# Patient Record
Sex: Male | Born: 1985 | Race: Black or African American | Hispanic: No
Health system: Midwestern US, Community
[De-identification: ages and names within clinical notes are randomized; demographics above are authoritative.]

## PROBLEM LIST (undated history)

## (undated) DIAGNOSIS — K279 Peptic ulcer, site unspecified, unspecified as acute or chronic, without hemorrhage or perforation: Secondary | ICD-10-CM

## (undated) HISTORY — PX: ABDOMINAL SURGERY: SHX537

---

## 2017-10-21 ENCOUNTER — Emergency Department (HOSPITAL_COMMUNITY)
Admission: EM | Admit: 2017-10-21 | Discharge: 2017-10-21 | Disposition: A | Discharge status: Home / Self Care | Payer: Self-pay | Attending: Emergency Medicine | Admitting: Emergency Medicine

## 2017-10-21 ENCOUNTER — Encounter (HOSPITAL_COMMUNITY): Payer: Self-pay | Admitting: Emergency Medicine

## 2017-10-21 DIAGNOSIS — R110 Nausea: Secondary | ICD-10-CM | POA: Insufficient documentation

## 2017-10-21 DIAGNOSIS — R070 Pain in throat: Secondary | ICD-10-CM | POA: Insufficient documentation

## 2017-10-21 DIAGNOSIS — Z87891 Personal history of nicotine dependence: Secondary | ICD-10-CM | POA: Insufficient documentation

## 2017-10-21 DIAGNOSIS — R1013 Epigastric pain: Secondary | ICD-10-CM | POA: Insufficient documentation

## 2017-10-21 DIAGNOSIS — R197 Diarrhea, unspecified: Secondary | ICD-10-CM | POA: Insufficient documentation

## 2017-10-21 LAB — COMPREHENSIVE METABOLIC PANEL
ALBUMIN: 4 g/dL (ref 3.5–5.0)
ALK PHOS: 60 U/L (ref 38–126)
ALT: 31 U/L (ref 17–63)
AST: 27 U/L (ref 15–41)
Anion gap: 13 (ref 5–15)
BILIRUBIN TOTAL: 1 mg/dL (ref 0.3–1.2)
BUN: 9 mg/dL (ref 6–20)
CALCIUM: 9.3 mg/dL (ref 8.9–10.3)
CO2: 25 mmol/L (ref 22–32)
Chloride: 100 mmol/L — ABNORMAL LOW (ref 101–111)
Creatinine, Ser: 1.16 mg/dL (ref 0.61–1.24)
GFR calc Af Amer: >60 mL/min (ref >60)
GFR calc non Af Amer: >60 mL/min (ref >60)
GLUCOSE: 94 mg/dL (ref 65–99)
POTASSIUM: 3.8 mmol/L (ref 3.5–5.1)
SODIUM: 138 mmol/L (ref 135–145)
TOTAL PROTEIN: 7.9 g/dL (ref 6.5–8.1)

## 2017-10-21 LAB — LIPASE, BLOOD: Lipase: 22 U/L (ref 11–51)

## 2017-10-21 LAB — URINALYSIS, ROUTINE W REFLEX MICROSCOPIC
BILIRUBIN URINE: NEGATIVE
Glucose, UA: NEGATIVE mg/dL
Ketones, ur: 20 mg/dL — AB
Leukocytes, UA: NEGATIVE
NITRITE: NEGATIVE
PH: 6 (ref 5.0–8.0)
Protein, ur: NEGATIVE mg/dL
SPECIFIC GRAVITY, URINE: 1.01 (ref 1.005–1.030)

## 2017-10-21 LAB — CBC
HEMATOCRIT: 44.1 % (ref 39.0–52.0)
HEMOGLOBIN: 15.3 g/dL (ref 13.0–17.0)
MCH: 30.8 pg (ref 26.0–34.0)
MCHC: 34.7 g/dL (ref 30.0–36.0)
MCV: 88.7 fL (ref 78.0–100.0)
Platelets: 142 10*3/uL — ABNORMAL LOW (ref 150–400)
RBC: 4.97 MIL/uL (ref 4.22–5.81)
RDW: 13.3 % (ref 11.5–15.5)
WBC: 4.8 10*3/uL (ref 4.0–10.5)

## 2017-10-21 MED ORDER — AZITHROMYCIN 250 MG PO TABS
500.0000 mg | ORAL_TABLET | Freq: Once | ORAL | Status: AC
  Administered 2017-10-21: 500 mg via ORAL
  Filled 2017-10-21: qty 2

## 2017-10-21 MED ORDER — PANTOPRAZOLE SODIUM 40 MG PO TBEC: 40.0000 mg | DELAYED_RELEASE_TABLET | Freq: Every day | ORAL | 0 refills | Status: DC

## 2017-10-21 MED ORDER — GI COCKTAIL ~~LOC~~
30.0000 mL | Freq: Once | ORAL | Status: AC
  Administered 2017-10-21: 30 mL via ORAL
  Filled 2017-10-21: qty 30

## 2017-10-21 MED ORDER — ACETAMINOPHEN 500 MG PO TABS
1000.0000 mg | ORAL_TABLET | Freq: Once | ORAL | Status: AC
  Administered 2017-10-21: 1000 mg via ORAL
  Filled 2017-10-21: qty 2

## 2017-10-21 MED ORDER — STERILE WATER FOR INJECTION IJ SOLN
INTRAMUSCULAR | Status: AC
  Administered 2017-10-21: 10 mL
  Filled 2017-10-21: qty 10

## 2017-10-21 MED ORDER — CEFTRIAXONE SODIUM 250 MG IJ SOLR
250.0000 mg | Freq: Once | INTRAMUSCULAR | Status: AC
  Administered 2017-10-21: 250 mg via INTRAMUSCULAR
  Filled 2017-10-21: qty 250

## 2017-10-21 MED ORDER — FAMOTIDINE 20 MG PO TABS
20.0000 mg | ORAL_TABLET | Freq: Once | ORAL | Status: AC
  Administered 2017-10-21: 20 mg via ORAL
  Filled 2017-10-21: qty 1

## 2017-10-21 NOTE — Discharge Instructions (Addendum)
It was our pleasure to provide your ER care today - we hope that you feel better.  Take protonix (acid blocker medication). You may also take pepcid or maalox as need for symptom relief.  Follow up with primary care doctor in the coming week.  Return to ER if worse, new symptoms, worsening abdominal pain, persistent vomiting, fevers, other concern.

## 2017-10-21 NOTE — ED Triage Notes (Signed)
Patient here from home with complaints of abdominal pain x3 days. Reports hx of stomach ulcers. Nausea, no vomiting.

## 2017-10-21 NOTE — ED Notes (Signed)
Fluids offer

## 2017-10-21 NOTE — ED Provider Notes (Signed)
[facility]-EMERGENCY DEPT Provider Note   CSN: [id number] Arrival date & time: 10/21/17  4098     History   Chief Complaint Chief Complaint  Patient presents with  . Abdominal Pain  . Nausea    HPI Devonta Blanford is a 32 y.o. male.  Patient c/o mid abd, epigastric pain in past couple days. Pain dull, intermittent, mild-moderate, without specific exacerbating or alleviating factors. Has had a few episodes of diarrhea. No vomiting. Also states scratchy throat, mildly sore, no trouble breathing or swallowing. Denies cough or sob. No chest pain. Denies dysuria or gu c/o - but then states recent unprotected sex and states the woman subsequently told him she had chlamydia. No penile discharge. No skin lesions.   The history is provided by the patient.    History reviewed. No pertinent past medical history.  There are no active problems to display for this patient.   History reviewed. No pertinent surgical history.      Home Medications    Prior to Admission medications   Not on File    Family History No family history on file.  Social History Social History   Tobacco Use  . Smoking status: Former Games developer  . Smokeless tobacco: Never Used  Substance Use Topics  . Alcohol use: Never    Frequency: Never  . Drug use: Never     Allergies   Patient has no known allergies.   Review of Systems Review of Systems  Constitutional: Negative for fever.  HENT: Negative for rhinorrhea.   Eyes: Negative for redness.  Respiratory: Negative for cough and shortness of breath.   Cardiovascular: Negative for chest pain.  Gastrointestinal: Negative for vomiting.  Genitourinary: Negative for flank pain.  Musculoskeletal: Negative for back pain and neck pain.  Skin: Negative for rash.  Neurological: Negative for headaches.  Hematological: Does not bruise/bleed easily.  Psychiatric/Behavioral: Negative for confusion.     Physical Exam Updated Vital  Signs BP 119/68 (BP Location: Left Arm)   Pulse 72   Temp 98.9 F (37.2 C) (Oral)   Resp 18   SpO2 98%   Physical Exam  Constitutional: He appears well-developed and well-nourished. No distress.  HENT:  Mouth/Throat: Oropharynx is clear and moist.  Eyes: Conjunctivae are normal.  Neck: Neck supple. No tracheal deviation present.  Cardiovascular: Normal rate, regular rhythm, normal heart sounds and intact distal pulses. Exam reveals no gallop and no friction rub.  No murmur heard. Pulmonary/Chest: Effort normal and breath sounds normal. No accessory muscle usage. No respiratory distress.  Abdominal: Soft. Bowel sounds are normal. He exhibits no distension and no mass. There is no tenderness. There is no rebound and no guarding. No hernia.  Genitourinary:  Genitourinary Comments: No cva tenderness.   Musculoskeletal: He exhibits no edema.  Neurological: He is alert.  Skin: Skin is warm and dry. No rash noted. He is not diaphoretic.  Psychiatric: He has a normal mood and affect.  Nursing note and vitals reviewed.    ED Treatments / Results  Labs (all labs ordered are listed, but only abnormal results are displayed) Results for orders placed or performed during the hospital encounter of 10/21/17  Lipase, blood  Result Value Ref Range   Lipase 22 11 - 51 U/L  Comprehensive metabolic panel  Result Value Ref Range   Sodium 138 135 - 145 mmol/L   Potassium 3.8 3.5 - 5.1 mmol/L   Chloride 100 (L) 101 - 111 mmol/L   CO2 25 22 -  32 mmol/L   Glucose, Bld 94 65 - 99 mg/dL   BUN 9 6 - 20 mg/dL   Creatinine, Ser 4.09 0.61 - 1.24 mg/dL   Calcium 9.3 8.9 - 81.1 mg/dL   Total Protein 7.9 6.5 - 8.1 g/dL   Albumin 4.0 3.5 - 5.0 g/dL   AST 27 15 - 41 U/L   ALT 31 17 - 63 U/L   Alkaline Phosphatase 60 38 - 126 U/L   Total Bilirubin 1.0 0.3 - 1.2 mg/dL   GFR calc non Af Amer >60 >60 mL/min   GFR calc Af Amer >60 >60 mL/min   Anion gap 13 5 - 15  CBC  Result Value Ref Range   WBC 4.8  4.0 - 10.5 K/uL   RBC 4.97 4.22 - 5.81 MIL/uL   Hemoglobin 15.3 13.0 - 17.0 g/dL   HCT 91.4 78.2 - 95.6 %   MCV 88.7 78.0 - 100.0 fL   MCH 30.8 26.0 - 34.0 pg   MCHC 34.7 30.0 - 36.0 g/dL   RDW 21.3 08.6 - 57.8 %   Platelets 142 (L) 150 - 400 K/uL  Urinalysis, Routine w reflex microscopic  Result Value Ref Range   Color, Urine YELLOW YELLOW   APPearance CLEAR CLEAR   Specific Gravity, Urine 1.010 1.005 - 1.030   pH 6.0 5.0 - 8.0   Glucose, UA NEGATIVE NEGATIVE mg/dL   Hgb urine dipstick MODERATE (A) NEGATIVE   Bilirubin Urine NEGATIVE NEGATIVE   Ketones, ur 20 (A) NEGATIVE mg/dL   Protein, ur NEGATIVE NEGATIVE mg/dL   Nitrite NEGATIVE NEGATIVE   Leukocytes, UA NEGATIVE NEGATIVE   RBC / HPF 0-5 0 - 5 RBC/hpf   WBC, UA 0-5 0 - 5 WBC/hpf   Bacteria, UA RARE (A) NONE SEEN   Mucus PRESENT     EKG None  Radiology No results found.  Procedures Procedures (including critical care time)  Medications Ordered in ED Medications  famotidine (PEPCID) tablet 20 mg (has no administration in time range)  gi cocktail (Maalox,Lidocaine,Donnatal) (has no administration in time range)  acetaminophen (TYLENOL) tablet 1,000 mg (has no administration in time range)  cefTRIAXone (ROCEPHIN) injection 250 mg (has no administration in time range)  azithromycin (ZITHROMAX) tablet 500 mg (has no administration in time range)     Initial Impression / Assessment and Plan / ED Course  I have reviewed the triage vital signs and the nursing notes.  Pertinent labs & imaging results that were available during my care of the patient were reviewed by me and considered in my medical decision making (see chart for details).  Labs.  Labs reviewed - chem normal.  abd soft nt.  Hx gastritis/pud. pepcid po. Gi cocktail po.  Pt requests rx for possible chlamydia exposure. Rocephin im. zitrhomax po.  Tolerating po fluids.  Benign abd exam, no nv.   Pt currently appears stable for d/c.      Final Clinical Impressions(s) / ED Diagnoses   Final diagnoses:  None    ED Discharge Orders    None       Cathren Laine, MD 10/21/17 1208

## 2018-02-26 ENCOUNTER — Emergency Department (HOSPITAL_COMMUNITY)
Admission: EM | Admit: 2018-02-26 | Discharge: 2018-02-27 | Disposition: A | Discharge status: Home / Self Care | Payer: Self-pay | Attending: Emergency Medicine | Admitting: Emergency Medicine

## 2018-02-26 ENCOUNTER — Other Ambulatory Visit: Payer: Self-pay

## 2018-02-26 ENCOUNTER — Encounter (HOSPITAL_COMMUNITY): Payer: Self-pay | Admitting: Emergency Medicine

## 2018-02-26 DIAGNOSIS — R300 Dysuria: Secondary | ICD-10-CM | POA: Insufficient documentation

## 2018-02-26 DIAGNOSIS — Z79899 Other long term (current) drug therapy: Secondary | ICD-10-CM | POA: Insufficient documentation

## 2018-02-26 HISTORY — DX: Peptic ulcer, site unspecified, unspecified as acute or chronic, without hemorrhage or perforation: K27.9

## 2018-02-26 LAB — CBC
HCT: 42.7 % (ref 39.0–52.0)
Hemoglobin: 14.5 g/dL (ref 13.0–17.0)
MCH: 30.9 pg (ref 26.0–34.0)
MCHC: 34 g/dL (ref 30.0–36.0)
MCV: 90.9 fL (ref 78.0–100.0)
PLATELETS: 172 10*3/uL (ref 150–400)
RBC: 4.7 MIL/uL (ref 4.22–5.81)
RDW: 12.8 % (ref 11.5–15.5)
WBC: 7.1 10*3/uL (ref 4.0–10.5)

## 2018-02-26 LAB — COMPREHENSIVE METABOLIC PANEL
ALK PHOS: 47 U/L (ref 38–126)
ALT: 18 U/L (ref 0–44)
AST: 18 U/L (ref 15–41)
Albumin: 3.7 g/dL (ref 3.5–5.0)
Anion gap: 9 (ref 5–15)
BILIRUBIN TOTAL: 0.7 mg/dL (ref 0.3–1.2)
BUN: 8 mg/dL (ref 6–20)
CALCIUM: 9.2 mg/dL (ref 8.9–10.3)
CO2: 28 mmol/L (ref 22–32)
CREATININE: 1.4 mg/dL — AB (ref 0.61–1.24)
Chloride: 105 mmol/L (ref 98–111)
GFR calc non Af Amer: >60 mL/min (ref >60)
Glucose, Bld: 104 mg/dL — ABNORMAL HIGH (ref 70–99)
Potassium: 3.3 mmol/L — ABNORMAL LOW (ref 3.5–5.1)
SODIUM: 142 mmol/L (ref 135–145)
TOTAL PROTEIN: 6 g/dL — AB (ref 6.5–8.1)

## 2018-02-26 LAB — URINALYSIS, ROUTINE W REFLEX MICROSCOPIC
BILIRUBIN URINE: NEGATIVE
Glucose, UA: NEGATIVE mg/dL
Hgb urine dipstick: NEGATIVE
Ketones, ur: NEGATIVE mg/dL
Leukocytes, UA: NEGATIVE
NITRITE: NEGATIVE
PROTEIN: NEGATIVE mg/dL
Specific Gravity, Urine: 1.001 — ABNORMAL LOW (ref 1.005–1.030)
pH: 7 (ref 5.0–8.0)

## 2018-02-26 LAB — LIPASE, BLOOD: Lipase: 21 U/L (ref 11–51)

## 2018-02-26 MED ORDER — ONDANSETRON HCL 4 MG/2ML IJ SOLN
4.0000 mg | Freq: Once | INTRAMUSCULAR | Status: AC
  Administered 2018-02-27: 4 mg via INTRAVENOUS
  Filled 2018-02-26: qty 2

## 2018-02-26 MED ORDER — MORPHINE SULFATE (PF) 4 MG/ML IV SOLN
4.0000 mg | Freq: Once | INTRAVENOUS | Status: AC
  Administered 2018-02-27: 4 mg via INTRAVENOUS
  Filled 2018-02-26: qty 1

## 2018-02-26 MED ORDER — SODIUM CHLORIDE 0.9 % IV BOLUS
1000.0000 mL | Freq: Once | INTRAVENOUS | Status: AC
  Administered 2018-02-27: 1000 mL via INTRAVENOUS

## 2018-02-26 NOTE — ED Provider Notes (Signed)
[facility] EMERGENCY DEPARTMENT Provider Note   CSN: [id number] Arrival date & time: 02/26/18  2005     History   Chief Complaint Chief Complaint  Patient presents with  . Abdominal Pain    HPI Johnny Lynn is a 32 y.o. male.  Patient with past medical history remarkable for stomach ulcers presents to the emergency department with a chief complaint of right flank and right lower quadrant pain.  He states the symptoms started about 3 days ago and have progressively worsened.  He reports associated nausea without vomiting and urinary frequency.  He denies any fevers or chills.  He has not taken anything for his symptoms.  He denies a history of kidney stones.  Symptoms are worsened with movement and palpation.  The history is provided by the patient. No language interpreter was used.    Past Medical History:  Diagnosis Date  . Peptic ulcer     There are no active problems to display for this patient.   Past Surgical History:  Procedure Laterality Date  . ABDOMINAL SURGERY          Home Medications    Prior to Admission medications   Medication Sig Start Date End Date Taking? Authorizing Provider  pantoprazole (PROTONIX) 40 MG tablet Take 1 tablet (40 mg total) by mouth daily. 10/21/17   Cathren Laine, MD    Family History No family history on file.  Social History Social History   Tobacco Use  . Smoking status: Former Games developer  . Smokeless tobacco: Never Used  Substance Use Topics  . Alcohol use: Never    Frequency: Never  . Drug use: Yes    Types: Marijuana     Allergies   Patient has no known allergies.   Review of Systems Review of Systems  All other systems reviewed and are negative.    Physical Exam Updated Vital Signs BP 129/62   Pulse 82   Temp 98 F (36.7 C)   Resp 18   SpO2 99%   Physical Exam  Constitutional: He is oriented to person, place, and time. He appears well-developed and well-nourished.  HENT:    Head: Normocephalic and atraumatic.  Eyes: Pupils are equal, round, and reactive to light. Conjunctivae and EOM are normal. Right eye exhibits no discharge. Left eye exhibits no discharge. No scleral icterus.  Neck: Normal range of motion. Neck supple. No JVD present.  Cardiovascular: Normal rate, regular rhythm and normal heart sounds. Exam reveals no gallop and no friction rub.  No murmur heard. Pulmonary/Chest: Effort normal and breath sounds normal. No respiratory distress. He has no wheezes. He has no rales. He exhibits no tenderness.  Abdominal: Soft. He exhibits no distension and no mass. There is tenderness in the right lower quadrant. There is no rebound and no guarding.  Musculoskeletal: Normal range of motion. He exhibits no edema or tenderness.  Neurological: He is alert and oriented to person, place, and time.  Skin: Skin is warm and dry.  Psychiatric: He has a normal mood and affect. His behavior is normal. Judgment and thought content normal.  Nursing note and vitals reviewed.    ED Treatments / Results  Labs (all labs ordered are listed, but only abnormal results are displayed) Labs Reviewed  COMPREHENSIVE METABOLIC PANEL - Abnormal; Notable for the following components:      Result Value   Potassium 3.3 (*)    Glucose, Bld 104 (*)    Creatinine, Ser 1.40 (*)  Total Protein 6.0 (*)    All other components within normal limits  URINALYSIS, ROUTINE W REFLEX MICROSCOPIC - Abnormal; Notable for the following components:   Color, Urine COLORLESS (*)    Specific Gravity, Urine 1.001 (*)    All other components within normal limits  LIPASE, BLOOD  CBC    EKG None  Radiology No results found.  Procedures Procedures (including critical care time)  Medications Ordered in ED Medications  morphine 4 MG/ML injection 4 mg (has no administration in time range)  ondansetron (ZOFRAN) injection 4 mg (has no administration in time range)     Initial Impression /  Assessment and Plan / ED Course  I have reviewed the triage vital signs and the nursing notes.  Pertinent labs & imaging results that were available during my care of the patient were reviewed by me and considered in my medical decision making (see chart for details).     Patient with right lower quadrant and right flank pain.  No penile discharge.  He does have some urinary frequency, but urinalysis is largely unremarkable.  He does have tenderness in the right lower quadrant.  Is noted be afebrile and vital signs are stable.  There is some concern for stone, but given patient's tenderness, would also benefit from CT to rule out appendicitis.  CT is negative.  I will give Keflex given the patient's reported dysuria.  He denies any penile discharge or new sexual partners.  Final Clinical Impressions(s) / ED Diagnoses   Final diagnoses:  Dysuria    ED Discharge Orders    None       Roxy HorsemanBrowning, Kendahl Bumgardner, PA-C 02/27/18 0252    Palumbo, April, MD 02/27/18 [phone]

## 2018-02-26 NOTE — ED Provider Notes (Signed)
Patient placed in Quick Look pathway, seen and evaluated   Chief Complaint: abdominal pain  HPI:   Johnny Lynn is a 32 y.o. male who presents to the ED with RLQ abdominal pain and penis pain. Patient reports urinary frequency, nausea, dry heaves and diarrhea. Symptoms started 3 days ago and have gotten worse.   ROS: GI: nausea, diarrhea, dysuria  Physical Exam:  BP 129/62   Pulse 82   Temp 98 F (36.7 C)   Resp 18   SpO2 99%    Gen: No distress  Neuro: Awake and Alert  Skin: Warm and dry  GI: abdomen soft, tender with palpation RLQ     Initiation of care has begun. The patient has been counseled on the process, plan, and necessity for staying for the completion/evaluation, and the remainder of the medical screening examination    Janne Napoleoneese, Aqua Denslow M, NP 02/26/18 2026    Tilden Fossaees, Elizabeth, MD 02/27/18 [phone]

## 2018-02-26 NOTE — ED Triage Notes (Signed)
Pt reports intermittent RLQ abdominal pain and penis pain. Pt reports urinary frequency, nausea, dry heaves, diarrhea. Pt denies vomiting.

## 2018-02-27 ENCOUNTER — Emergency Department (HOSPITAL_COMMUNITY): Payer: Self-pay

## 2018-02-27 MED ORDER — CEPHALEXIN 500 MG PO CAPS: 500.0000 mg | ORAL_CAPSULE | Freq: Four times a day (QID) | ORAL | 0 refills | Status: DC

## 2018-02-27 MED ORDER — IOHEXOL 300 MG/ML  SOLN
100.0000 mL | Freq: Once | INTRAMUSCULAR | Status: AC | PRN
  Administered 2018-02-27: 100 mL via INTRAVENOUS

## 2018-02-27 NOTE — ED Notes (Signed)
Patient verbalizes understanding of discharge instructions. Opportunity for questioning and answers were provided. Armband removed by staff, pt discharged from ED ambulatory.   

## 2018-02-27 NOTE — ED Notes (Signed)
Patient transported to CT 

## 2018-03-24 ENCOUNTER — Other Ambulatory Visit: Payer: Self-pay

## 2018-03-24 ENCOUNTER — Emergency Department (HOSPITAL_COMMUNITY): Payer: Self-pay

## 2018-03-24 ENCOUNTER — Encounter (HOSPITAL_COMMUNITY): Payer: Self-pay | Admitting: Emergency Medicine

## 2018-03-24 ENCOUNTER — Emergency Department (HOSPITAL_COMMUNITY)
Admission: EM | Admit: 2018-03-24 | Discharge: 2018-03-25 | Disposition: A | Discharge status: Home / Self Care | Payer: Self-pay | Attending: Emergency Medicine | Admitting: Emergency Medicine

## 2018-03-24 DIAGNOSIS — L0201 Cutaneous abscess of face: Secondary | ICD-10-CM | POA: Insufficient documentation

## 2018-03-24 DIAGNOSIS — R109 Unspecified abdominal pain: Secondary | ICD-10-CM | POA: Insufficient documentation

## 2018-03-24 DIAGNOSIS — Z79899 Other long term (current) drug therapy: Secondary | ICD-10-CM | POA: Insufficient documentation

## 2018-03-24 DIAGNOSIS — Z87891 Personal history of nicotine dependence: Secondary | ICD-10-CM | POA: Insufficient documentation

## 2018-03-24 LAB — URINALYSIS, ROUTINE W REFLEX MICROSCOPIC
Bacteria, UA: NONE SEEN
Bilirubin Urine: NEGATIVE
GLUCOSE, UA: NEGATIVE mg/dL
Ketones, ur: NEGATIVE mg/dL
LEUKOCYTES UA: NEGATIVE
Nitrite: NEGATIVE
PH: 5 (ref 5.0–8.0)
Protein, ur: NEGATIVE mg/dL
SPECIFIC GRAVITY, URINE: 1.012 (ref 1.005–1.030)

## 2018-03-24 LAB — CBC
HEMATOCRIT: 46.4 % (ref 39.0–52.0)
HEMOGLOBIN: 15.5 g/dL (ref 13.0–17.0)
MCH: 30.1 pg (ref 26.0–34.0)
MCHC: 33.4 g/dL (ref 30.0–36.0)
MCV: 90.1 fL (ref 80.0–100.0)
NRBC: 0 % (ref 0.0–0.2)
Platelets: 229 10*3/uL (ref 150–400)
RBC: 5.15 MIL/uL (ref 4.22–5.81)
RDW: 13.1 % (ref 11.5–15.5)
WBC: 6.7 10*3/uL (ref 4.0–10.5)

## 2018-03-24 LAB — COMPREHENSIVE METABOLIC PANEL
ALT: 24 U/L (ref 0–44)
AST: 21 U/L (ref 15–41)
Albumin: 3.9 g/dL (ref 3.5–5.0)
Alkaline Phosphatase: 49 U/L (ref 38–126)
Anion gap: 12 (ref 5–15)
BUN: 9 mg/dL (ref 6–20)
CHLORIDE: 103 mmol/L (ref 98–111)
CO2: 26 mmol/L (ref 22–32)
Calcium: 9.7 mg/dL (ref 8.9–10.3)
Creatinine, Ser: 1.17 mg/dL (ref 0.61–1.24)
GFR calc non Af Amer: >60 mL/min (ref >60)
Glucose, Bld: 74 mg/dL (ref 70–99)
POTASSIUM: 3.8 mmol/L (ref 3.5–5.1)
SODIUM: 141 mmol/L (ref 135–145)
Total Bilirubin: 0.8 mg/dL (ref 0.3–1.2)
Total Protein: 6.7 g/dL (ref 6.5–8.1)

## 2018-03-24 LAB — LIPASE, BLOOD: LIPASE: 25 U/L (ref 11–51)

## 2018-03-24 MED ORDER — HALOPERIDOL LACTATE 5 MG/ML IJ SOLN
2.5000 mg | Freq: Once | INTRAMUSCULAR | Status: AC
  Administered 2018-03-24: 2.5 mg via INTRAVENOUS
  Filled 2018-03-24: qty 1

## 2018-03-24 MED ORDER — DIPHENHYDRAMINE HCL 50 MG/ML IJ SOLN
25.0000 mg | Freq: Once | INTRAMUSCULAR | Status: AC
  Administered 2018-03-24: 25 mg via INTRAVENOUS
  Filled 2018-03-24: qty 1

## 2018-03-24 MED ORDER — DICYCLOMINE HCL 10 MG PO CAPS
20.0000 mg | ORAL_CAPSULE | Freq: Once | ORAL | Status: AC
  Administered 2018-03-24: 20 mg via ORAL
  Filled 2018-03-24: qty 2

## 2018-03-24 MED ORDER — DICYCLOMINE HCL 20 MG PO TABS: 20.0000 mg | ORAL_TABLET | Freq: Two times a day (BID) | ORAL | 0 refills | Status: DC

## 2018-03-24 MED ORDER — DOXYCYCLINE HYCLATE 100 MG PO CAPS: 100.0000 mg | ORAL_CAPSULE | Freq: Two times a day (BID) | ORAL | 0 refills | Status: DC

## 2018-03-24 MED ORDER — SODIUM CHLORIDE 0.9 % IV BOLUS
1000.0000 mL | Freq: Once | INTRAVENOUS | Status: AC
  Administered 2018-03-24: 1000 mL via INTRAVENOUS

## 2018-03-24 MED ORDER — ONDANSETRON 4 MG PO TBDP: 4.0000 mg | ORAL_TABLET | Freq: Three times a day (TID) | ORAL | 0 refills | Status: DC | PRN

## 2018-03-24 MED ORDER — PANTOPRAZOLE SODIUM 40 MG PO TBEC: 40.0000 mg | DELAYED_RELEASE_TABLET | Freq: Every day | ORAL | 0 refills | Status: DC

## 2018-03-24 MED ORDER — MORPHINE SULFATE (PF) 4 MG/ML IV SOLN: 4.0000 mg | Freq: Once | INTRAVENOUS | Status: DC

## 2018-03-24 NOTE — ED Triage Notes (Signed)
Pt reports abdominal pain/flank pain, constipation, nausea x 3 days. Pt also states he has a hx of bleeding ulcers require transfusion. States he has been taking aleve last taken at 1530 reports it has helped with the pain. Denies any urinary symptoms at this time.  Pt also c/o abscess or possible spider bite to his left lower face.

## 2018-03-24 NOTE — Discharge Instructions (Addendum)
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  This is a syndrome seen in people who use marijuana. It is associated with nausea, vomiting, abdominal pain, and generally not feeling well.  Often times patients will report that their symptoms can be relieved with a warm shower.  The best way to prevent this is to completely stop smoking/using all forms of marijuana.  Some people find symptom relief from applying capsaicin cream to their abdomen (belly).  Please be advised that capsaicin cream may cause a burning feeling.  Capsaicin cream may relieve some chest symptoms, however it will not prevent or fully resolve this condition. ° ° ° °

## 2018-03-24 NOTE — ED Provider Notes (Signed)
[facility] EMERGENCY DEPARTMENT Provider Note   CSN: [id number] Arrival date & time: 03/24/18  1737     History   Chief Complaint Chief Complaint  Patient presents with  . Abdominal Pain    HPI Johnny Lynn is a 32 y.o. male who presents today for evaluation of abdominal/flank pain, constipation, and nausea for 3 days.  He states that he has a history of bleeding ulcers requiring transfusion.  He was seen on 9/20 in the ER for similar symptoms.  He reports that, aside from a facial boil, his other symptoms are exactly the same.  Abdominal pain is primarily bilateral lower quadrants.  He denies urinary symptoms.  He reports nausea with out vomiting, 2 episodes of loose stools, and occasional cough.  No recent trauma.  No significant changes since his ED visit about 20 days ago.    He reports daily marijuana use.  He reports that he has a boil on his face that drained a few days ago however is feeling back up again.  He initially states that this was a spider bite, however then states that he has not seen any spiders or insects in the area recently.    HPI  Past Medical History:  Diagnosis Date  . Peptic ulcer     There are no active problems to display for this patient.   Past Surgical History:  Procedure Laterality Date  . ABDOMINAL SURGERY          Home Medications    Prior to Admission medications   Medication Sig Start Date End Date Taking? Authorizing Provider  naproxen sodium (ALEVE) 220 MG tablet Take 220 mg by mouth 2 (two) times daily as needed (for pain).   Yes [provider]  cephALEXin (KEFLEX) 500 MG capsule Take 1 capsule (500 mg total) by mouth 4 (four) times daily. Patient not taking: Reported on 03/24/2018 02/27/18   Roxy Horseman, PA-C  doxycycline (VIBRAMYCIN) 100 MG capsule Take 1 capsule (100 mg total) by mouth 2 (two) times daily for 7 days. 03/24/18 03/31/18  Cristina Gong, PA-C  pantoprazole (PROTONIX)  40 MG tablet Take 1 tablet (40 mg total) by mouth daily for 14 days. 03/24/18 04/07/18  Cristina Gong, PA-C    Family History History reviewed. No pertinent family history.  Social History Social History   Tobacco Use  . Smoking status: Former Games developer  . Smokeless tobacco: Never Used  Substance Use Topics  . Alcohol use: Never    Frequency: Never  . Drug use: Yes    Types: Marijuana     Allergies   Reglan [metoclopramide]   Review of Systems Review of Systems  Constitutional: Negative for chills and fever.  Respiratory: Positive for cough. Negative for chest tightness and shortness of breath.   Gastrointestinal: Positive for abdominal pain and nausea. Negative for vomiting.  Genitourinary: Negative for discharge, dysuria, penile pain and urgency.  Musculoskeletal: Negative for neck pain.  Skin: Positive for wound.  All other systems reviewed and are negative.    Physical Exam Updated Vital Signs BP 117/61   Pulse (!) 59   Temp 97.9 F (36.6 C) (Oral)   Resp 16   SpO2 98%   Physical Exam  Constitutional: He appears well-developed and well-nourished. No distress.  HENT:  Head: Normocephalic and atraumatic.  1 cm x 1 cm abscess on left anterior jaw.  Eyes: Conjunctivae are normal.  Neck: Neck supple.  Cardiovascular: Normal rate, regular rhythm and normal heart  sounds.  No murmur heard. Pulmonary/Chest: Effort normal and breath sounds normal. No respiratory distress.  Abdominal: Soft. Normal appearance and bowel sounds are normal. There is generalized tenderness and tenderness in the right lower quadrant and suprapubic area. There is no rigidity, no rebound, no guarding and no CVA tenderness.  Musculoskeletal: He exhibits no edema.  Neurological: He is alert.  Skin: Skin is warm and dry.  Psychiatric: He has a normal mood and affect.  Nursing note and vitals reviewed.    ED Treatments / Results  Labs (all labs ordered are listed, but only abnormal  results are displayed) Labs Reviewed  URINALYSIS, ROUTINE W REFLEX MICROSCOPIC - Abnormal; Notable for the following components:      Result Value   Hgb urine dipstick MODERATE (*)    All other components within normal limits  URINE CULTURE  LIPASE, BLOOD  COMPREHENSIVE METABOLIC PANEL  CBC    EKG EKG Interpretation  Date/Time:  Tuesday March 24 2018 20:55:37 EDT Ventricular Rate:  59 PR Interval:    QRS Duration: 96 QT Interval:  351 QTC Calculation: 348 R Axis:   85 Text Interpretation:  Sinus rhythm Borderline short PR interval Consider right atrial enlargement Borderline T abnormalities, inferior leads Confirmed by Virgina Norfolk [phone]) on 03/24/2018 8:57:25 PM   Radiology Dg Chest 2 View  Result Date: 03/24/2018 CLINICAL DATA:  Cough with brown mucus EXAM: CHEST - 2 VIEW COMPARISON:  None. FINDINGS: The heart size and mediastinal contours are within normal limits. Both lungs are clear. The visualized skeletal structures are unremarkable. IMPRESSION: No active cardiopulmonary disease. Electronically Signed   By: Jasmine Pang M.D.   On: 03/24/2018 21:15    Procedures .Marland KitchenIncision and Drainage Date/Time: 03/24/2018 9:58 PM Performed by: Cristina Gong, PA-C Authorized by: Cristina Gong, PA-C   Consent:    Consent obtained:  Verbal   Consent given by:  Patient   Risks discussed:  Bleeding, incomplete drainage, pain and infection (Scarring, need for repeat procedure.) Location:    Type:  Abscess   Size:  1cmx1cm   Location:  Head   Head location:  Face Pre-procedure details:    Skin preparation:  Chloraprep Procedure details:    Needle aspiration: yes     Needle size:  18 G   Drainage:  Purulent   Drainage amount:  Scant   Wound treatment:  Wound left open Post-procedure details:    Patient tolerance of procedure:  Tolerated well, no immediate complications Comments:     Discussed with patient options including local anesthetic, scalpel  incision, large needle aspiration, he elected for a large needle aspirate aspiration without local anesthetic.   (including critical care time)  Medications Ordered in ED Medications  sodium chloride 0.9 % bolus 1,000 mL (1,000 mLs Intravenous New Bag/Given 03/24/18 2119)  dicyclomine (BENTYL) capsule 20 mg (20 mg Oral Given 03/24/18 2123)  diphenhydrAMINE (BENADRYL) injection 25 mg (25 mg Intravenous Given 03/24/18 2122)  haloperidol lactate (HALDOL) injection 2.5 mg (2.5 mg Intravenous Given 03/24/18 2120)     Initial Impression / Assessment and Plan / ED Course  I have reviewed the triage vital signs and the nursing notes.  Pertinent labs & imaging results that were available during my care of the patient were reviewed by me and considered in my medical decision making (see chart for details).    Johnny Lynn presents today for evaluation of abdominal pain.  He was seen 02/26/2018 for what he reports are exactly the same symptoms.  At that time he underwent CT scan which was unremarkable, no evidence of renal stones.  Given normal CT scan with similar symptoms, CT scan is not indicated at this time.  Chest x-ray was obtained due to his reported coughing up of brown streak material which was without acute abnormalities.  Given concern for cannabinoid hyperemesis he was treated with fluids, Bentyl, Benadryl, and Haldol, after which he reported a mild improvement in his pain.  I discussed with him possible options for incision and drainage of the small abscess on his face, he elected for needle aspiration which was performed with purulent material.  At shift change care was transferred to Sharilyn Sites PA-C who will follow pending studies, re-evaulate and determine disposition.      Final Clinical Impressions(s) / ED Diagnoses   Final diagnoses:  Abdominal pain, unspecified abdominal location    ED Discharge Orders         Ordered    pantoprazole (PROTONIX) 40 MG tablet  Daily       03/24/18 2252    doxycycline (VIBRAMYCIN) 100 MG capsule  2 times daily     03/24/18 2252           Cristina Gong, [place] 03/24/18 2253    Virgina Norfolk, DO 03/25/18 1624

## 2018-03-24 NOTE — ED Provider Notes (Addendum)
Assumed care from PA Needles at shift change.  See prior note for full H&P.  Briefly 32 year old male here with abdominal pain.  Seen for same recently with negative CT.  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  Labs overall reassuring, chest x-ray clear.  Had needle aspiration to face, plan for d/c with doxycycline.  Plan: Symptomatic control.  Likely discharge.  Results for orders placed or performed during the hospital encounter of 03/24/18  Lipase, blood  Result Value Ref Range   Lipase 25 11 - 51 U/L  Comprehensive metabolic panel  Result Value Ref Range   Sodium 141 135 - 145 mmol/L   Potassium 3.8 3.5 - 5.1 mmol/L   Chloride 103 98 - 111 mmol/L   CO2 26 22 - 32 mmol/L   Glucose, Bld 74 70 - 99 mg/dL   BUN 9 6 - 20 mg/dL   Creatinine, Ser 1.61 0.61 - 1.24 mg/dL   Calcium 9.7 8.9 - 09.6 mg/dL   Total Protein 6.7 6.5 - 8.1 g/dL   Albumin 3.9 3.5 - 5.0 g/dL   AST 21 15 - 41 U/L   ALT 24 0 - 44 U/L   Alkaline Phosphatase 49 38 - 126 U/L   Total Bilirubin 0.8 0.3 - 1.2 mg/dL   GFR calc non Af Amer >60 >60 mL/min   GFR calc Af Amer >60 >60 mL/min   Anion gap 12 5 - 15  CBC  Result Value Ref Range   WBC 6.7 4.0 - 10.5 K/uL   RBC 5.15 4.22 - 5.81 MIL/uL   Hemoglobin 15.5 13.0 - 17.0 g/dL   HCT 04.5 40.9 - 81.1 %   MCV 90.1 80.0 - 100.0 fL   MCH 30.1 26.0 - 34.0 pg   MCHC 33.4 30.0 - 36.0 g/dL   RDW 91.4 78.2 - 95.6 %   Platelets 229 150 - 400 K/uL   nRBC 0.0 0.0 - 0.2 %  Urinalysis, Routine w reflex microscopic  Result Value Ref Range   Color, Urine YELLOW YELLOW   APPearance CLEAR CLEAR   Specific Gravity, Urine 1.012 1.005 - 1.030   pH 5.0 5.0 - 8.0   Glucose, UA NEGATIVE NEGATIVE mg/dL   Hgb urine dipstick MODERATE (A) NEGATIVE   Bilirubin Urine NEGATIVE NEGATIVE   Ketones, ur NEGATIVE NEGATIVE mg/dL   Protein, ur NEGATIVE NEGATIVE mg/dL   Nitrite NEGATIVE NEGATIVE   Leukocytes, UA NEGATIVE NEGATIVE   Bacteria, UA NONE SEEN  NONE SEEN   Mucus PRESENT    Budding Yeast PRESENT    Dg Chest 2 View  Result Date: 03/24/2018 CLINICAL DATA:  Cough with brown mucus EXAM: CHEST - 2 VIEW COMPARISON:  None. FINDINGS: The heart size and mediastinal contours are within normal limits. Both lungs are clear. The visualized skeletal structures are unremarkable. IMPRESSION: No active cardiopulmonary disease. Electronically Signed   By: Jasmine Pang M.D.   On: 03/24/2018 21:15   Ct Abdomen Pelvis W Contrast  Result Date: 02/27/2018 CLINICAL DATA:  Intermittent right lower quadrant abdominal pain and penile pain. Urinary frequency, nausea, diarrhea. EXAM: CT ABDOMEN AND PELVIS WITH CONTRAST TECHNIQUE: Multidetector CT imaging of the abdomen and pelvis was performed using the standard protocol following bolus administration of intravenous contrast. CONTRAST:  OMNIPAQUE IOHEXOL 300 MG/ML  SOLN COMPARISON:  None. FINDINGS: Lower chest: Lung bases are clear. Hepatobiliary: No focal liver abnormality is seen. No gallstones, gallbladder wall thickening, or biliary dilatation. Pancreas: Unremarkable. No pancreatic ductal dilatation  or surrounding inflammatory changes. Spleen: Normal in size without focal abnormality. Adrenals/Urinary Tract: Adrenal glands are unremarkable. Kidneys are normal, without renal calculi, focal lesion, or hydronephrosis. Bladder is unremarkable. Stomach/Bowel: Stomach, small bowel, and colon are not abnormally distended. No wall thickening or inflammatory stranding is identified. The appendix is normal. Vascular/Lymphatic: No significant vascular findings are present. No enlarged abdominal or pelvic lymph nodes. Reproductive: Prostate is unremarkable. Other: No abdominal wall hernia or abnormality. No abdominopelvic ascites. Musculoskeletal: No acute or significant osseous findings. IMPRESSION: 1. No acute process demonstrated in the abdomen or pelvis. No evidence of bowel obstruction or inflammation. The appendix is  normal. 2. Mild hepatomegaly. No focal liver abnormality. Electronically Signed   By: Burman Nieves M.D.   On: 02/27/2018 01:32      11:47 PM Patient has been sleeping here in the ED.  Vitals are stable.  Appears comfortable.  No further emesis.  Will d/c home with scripts per PA Jeraldine Loots, add bentyl and zofran.  Can follow-up with PCP.  Encouraged marijuana cessation.  Return here for any new/acute changes.    Garlon Hatchet, PA-C 03/25/18 0008    Garlon Hatchet, PA-C 03/25/18 0009    Shon Baton, MD 03/25/18 [phone]

## 2018-03-27 ENCOUNTER — Emergency Department (HOSPITAL_COMMUNITY): Payer: Self-pay | Admitting: Anesthesiology

## 2018-03-27 ENCOUNTER — Inpatient Hospital Stay (HOSPITAL_COMMUNITY)
Admission: EM | Admit: 2018-03-27 | Discharge: 2018-04-03 | DRG: 327 | Disposition: A | Discharge status: Home / Self Care | Payer: Self-pay | Attending: Surgery | Admitting: Surgery

## 2018-03-27 ENCOUNTER — Encounter (HOSPITAL_COMMUNITY): Payer: Self-pay | Admitting: Emergency Medicine

## 2018-03-27 ENCOUNTER — Encounter (HOSPITAL_COMMUNITY): Admission: EM | Disposition: A | Discharge status: Home / Self Care | Payer: Self-pay | Source: Home / Self Care

## 2018-03-27 ENCOUNTER — Emergency Department (HOSPITAL_COMMUNITY): Payer: Self-pay

## 2018-03-27 DIAGNOSIS — F1490 Cocaine use, unspecified, uncomplicated: Secondary | ICD-10-CM | POA: Diagnosis present

## 2018-03-27 DIAGNOSIS — N179 Acute kidney failure, unspecified: Secondary | ICD-10-CM | POA: Diagnosis not present

## 2018-03-27 DIAGNOSIS — Z8711 Personal history of peptic ulcer disease: Secondary | ICD-10-CM

## 2018-03-27 DIAGNOSIS — Z87891 Personal history of nicotine dependence: Secondary | ICD-10-CM

## 2018-03-27 DIAGNOSIS — R0789 Other chest pain: Secondary | ICD-10-CM | POA: Diagnosis not present

## 2018-03-27 DIAGNOSIS — K255 Chronic or unspecified gastric ulcer with perforation: Principal | ICD-10-CM | POA: Diagnosis present

## 2018-03-27 DIAGNOSIS — R1013 Epigastric pain: Secondary | ICD-10-CM

## 2018-03-27 DIAGNOSIS — R198 Other specified symptoms and signs involving the digestive system and abdomen: Secondary | ICD-10-CM | POA: Diagnosis present

## 2018-03-27 DIAGNOSIS — K275 Chronic or unspecified peptic ulcer, site unspecified, with perforation: Secondary | ICD-10-CM

## 2018-03-27 DIAGNOSIS — Z888 Allergy status to other drugs, medicaments and biological substances status: Secondary | ICD-10-CM

## 2018-03-27 DIAGNOSIS — F1290 Cannabis use, unspecified, uncomplicated: Secondary | ICD-10-CM | POA: Diagnosis present

## 2018-03-27 DIAGNOSIS — R188 Other ascites: Secondary | ICD-10-CM | POA: Diagnosis present

## 2018-03-27 DIAGNOSIS — F1590 Other stimulant use, unspecified, uncomplicated: Secondary | ICD-10-CM | POA: Diagnosis present

## 2018-03-27 DIAGNOSIS — K219 Gastro-esophageal reflux disease without esophagitis: Secondary | ICD-10-CM | POA: Diagnosis present

## 2018-03-27 DIAGNOSIS — R1310 Dysphagia, unspecified: Secondary | ICD-10-CM

## 2018-03-27 HISTORY — PX: LAPAROTOMY: SHX154

## 2018-03-27 HISTORY — PX: GREATER OMENTAL FLAP CLOSURE: SHX6319

## 2018-03-27 LAB — COMPREHENSIVE METABOLIC PANEL
ALBUMIN: 4.2 g/dL (ref 3.5–5.0)
ALT: 18 U/L (ref 0–44)
ANION GAP: 13 (ref 5–15)
AST: 26 U/L (ref 15–41)
Alkaline Phosphatase: 45 U/L (ref 38–126)
BILIRUBIN TOTAL: 1.6 mg/dL — AB (ref 0.3–1.2)
BUN: 12 mg/dL (ref 6–20)
CHLORIDE: 101 mmol/L (ref 98–111)
CO2: 25 mmol/L (ref 22–32)
Calcium: 9.7 mg/dL (ref 8.9–10.3)
Creatinine, Ser: 1.25 mg/dL — ABNORMAL HIGH (ref 0.61–1.24)
GFR calc Af Amer: >60 mL/min (ref >60)
GFR calc non Af Amer: >60 mL/min (ref >60)
Glucose, Bld: 99 mg/dL (ref 70–99)
POTASSIUM: 3.9 mmol/L (ref 3.5–5.1)
SODIUM: 139 mmol/L (ref 135–145)
TOTAL PROTEIN: 7 g/dL (ref 6.5–8.1)

## 2018-03-27 LAB — CBC WITH DIFFERENTIAL/PLATELET
Abs Immature Granulocytes: 0.04 10*3/uL (ref 0.00–0.07)
BASOS ABS: 0 10*3/uL (ref 0.0–0.1)
BASOS PCT: 0 %
EOS PCT: 0 %
Eosinophils Absolute: 0 10*3/uL (ref 0.0–0.5)
HEMATOCRIT: 52.9 % — AB (ref 39.0–52.0)
Hemoglobin: 17.3 g/dL — ABNORMAL HIGH (ref 13.0–17.0)
IMMATURE GRANULOCYTES: 0 %
LYMPHS ABS: 1.1 10*3/uL (ref 0.7–4.0)
Lymphocytes Relative: 9 %
MCH: 29.8 pg (ref 26.0–34.0)
MCHC: 32.7 g/dL (ref 30.0–36.0)
MCV: 91.2 fL (ref 80.0–100.0)
Monocytes Absolute: 0.5 10*3/uL (ref 0.1–1.0)
Monocytes Relative: 4 %
NEUTROS PCT: 87 %
NRBC: 0 % (ref 0.0–0.2)
Neutro Abs: 11.5 10*3/uL — ABNORMAL HIGH (ref 1.7–7.7)
PLATELETS: 261 10*3/uL (ref 150–400)
RBC: 5.8 MIL/uL (ref 4.22–5.81)
RDW: 12.8 % (ref 11.5–15.5)
WBC: 13.2 10*3/uL — AB (ref 4.0–10.5)

## 2018-03-27 LAB — RAPID URINE DRUG SCREEN, HOSP PERFORMED
Amphetamines: POSITIVE — AB
BARBITURATES: NOT DETECTED
Benzodiazepines: NOT DETECTED
COCAINE: POSITIVE — AB
Opiates: NOT DETECTED
Tetrahydrocannabinol: POSITIVE — AB

## 2018-03-27 LAB — LIPASE, BLOOD: Lipase: 20 U/L (ref 11–51)

## 2018-03-27 LAB — URINALYSIS, ROUTINE W REFLEX MICROSCOPIC
BILIRUBIN URINE: NEGATIVE
Glucose, UA: NEGATIVE mg/dL
KETONES UR: 80 mg/dL — AB
LEUKOCYTES UA: NEGATIVE
NITRITE: NEGATIVE
PH: 5 (ref 5.0–8.0)
PROTEIN: NEGATIVE mg/dL
Specific Gravity, Urine: 1.027 (ref 1.005–1.030)

## 2018-03-27 LAB — URINE CULTURE: Culture: NO GROWTH

## 2018-03-27 SURGERY — LAPAROTOMY, EXPLORATORY
Anesthesia: General | Site: Abdomen

## 2018-03-27 MED ORDER — LIDOCAINE 2% (20 MG/ML) 5 ML SYRINGE
INTRAMUSCULAR | Status: AC
  Filled 2018-03-27: qty 5

## 2018-03-27 MED ORDER — PROPOFOL 10 MG/ML IV BOLUS
INTRAVENOUS | Status: DC | PRN
  Administered 2018-03-27: 200 mg via INTRAVENOUS

## 2018-03-27 MED ORDER — SUCCINYLCHOLINE 20MG/ML (10ML) SYRINGE FOR MEDFUSION PUMP - OPTIME
INTRAMUSCULAR | Status: DC | PRN
  Administered 2018-03-27: 120 mg via INTRAVENOUS

## 2018-03-27 MED ORDER — MIDAZOLAM HCL 2 MG/2ML IJ SOLN
INTRAMUSCULAR | Status: AC
  Filled 2018-03-27: qty 2

## 2018-03-27 MED ORDER — MIDAZOLAM HCL 2 MG/2ML IJ SOLN
INTRAMUSCULAR | Status: DC | PRN
  Administered 2018-03-27: 2 mg via INTRAVENOUS

## 2018-03-27 MED ORDER — PIPERACILLIN-TAZOBACTAM 3.375 G IVPB
3.3750 g | Freq: Three times a day (TID) | INTRAVENOUS | Status: DC
  Administered 2018-03-28 – 2018-04-03 (×19): 3.375 g via INTRAVENOUS
  Filled 2018-03-27 (×17): qty 50

## 2018-03-27 MED ORDER — FENTANYL CITRATE (PF) 250 MCG/5ML IJ SOLN
INTRAMUSCULAR | Status: DC | PRN
  Administered 2018-03-27 – 2018-03-28 (×2): 125 ug via INTRAVENOUS

## 2018-03-27 MED ORDER — SUCCINYLCHOLINE CHLORIDE 200 MG/10ML IV SOSY
PREFILLED_SYRINGE | INTRAVENOUS | Status: AC
  Filled 2018-03-27: qty 10

## 2018-03-27 MED ORDER — HYDROMORPHONE HCL 1 MG/ML IJ SOLN
1.0000 mg | Freq: Once | INTRAMUSCULAR | Status: AC
  Administered 2018-03-27: 1 mg via INTRAVENOUS
  Filled 2018-03-27: qty 1

## 2018-03-27 MED ORDER — DICYCLOMINE HCL 10 MG/ML IM SOLN
20.0000 mg | Freq: Once | INTRAMUSCULAR | Status: AC
  Administered 2018-03-27: 20 mg via INTRAMUSCULAR
  Filled 2018-03-27: qty 2

## 2018-03-27 MED ORDER — PROPOFOL 10 MG/ML IV BOLUS
INTRAVENOUS | Status: AC
  Filled 2018-03-27: qty 20

## 2018-03-27 MED ORDER — FENTANYL CITRATE (PF) 100 MCG/2ML IJ SOLN
25.0000 ug | Freq: Once | INTRAMUSCULAR | Status: AC
  Administered 2018-03-27: 25 ug via INTRAVENOUS
  Filled 2018-03-27: qty 2

## 2018-03-27 MED ORDER — LACTATED RINGERS IV SOLN
INTRAVENOUS | Status: DC | PRN
  Administered 2018-03-27 – 2018-03-28 (×2): via INTRAVENOUS

## 2018-03-27 MED ORDER — LIDOCAINE HCL (CARDIAC) PF 100 MG/5ML IV SOSY
PREFILLED_SYRINGE | INTRAVENOUS | Status: DC | PRN
  Administered 2018-03-27: 80 mg via INTRATRACHEAL

## 2018-03-27 MED ORDER — IOHEXOL 300 MG/ML  SOLN
100.0000 mL | Freq: Once | INTRAMUSCULAR | Status: AC | PRN
  Administered 2018-03-27: 100 mL via INTRAVENOUS

## 2018-03-27 MED ORDER — SODIUM CHLORIDE 0.9 % IV BOLUS
1000.0000 mL | Freq: Once | INTRAVENOUS | Status: AC
  Administered 2018-03-27: 1000 mL via INTRAVENOUS

## 2018-03-27 MED ORDER — DEXTROSE 5 % IV SOLN
INTRAVENOUS | Status: DC | PRN
  Administered 2018-03-27: 1 g via INTRAVENOUS

## 2018-03-27 MED ORDER — ROCURONIUM 10MG/ML (10ML) SYRINGE FOR MEDFUSION PUMP - OPTIME
INTRAVENOUS | Status: DC | PRN
  Administered 2018-03-27: 50 mg via INTRAVENOUS

## 2018-03-27 MED ORDER — PIPERACILLIN-TAZOBACTAM 3.375 G IVPB 30 MIN: 3.3750 g | Freq: Once | INTRAVENOUS | Status: DC

## 2018-03-27 MED ORDER — ROCURONIUM BROMIDE 50 MG/5ML IV SOSY
PREFILLED_SYRINGE | INTRAVENOUS | Status: AC
  Filled 2018-03-27: qty 5

## 2018-03-27 MED ORDER — CEFOXITIN SODIUM-DEXTROSE 1-4 GM-%(50ML) IV SOLR (PREMIX)
INTRAVENOUS | Status: AC
  Filled 2018-03-27: qty 50

## 2018-03-27 MED ORDER — FENTANYL CITRATE (PF) 250 MCG/5ML IJ SOLN
INTRAMUSCULAR | Status: AC
  Filled 2018-03-27: qty 5

## 2018-03-27 SURGICAL SUPPLY — 41 items
BIOPATCH RED 1 DISK 7.0 (GAUZE/BANDAGES/DRESSINGS) ×2 IMPLANT
BLADE CLIPPER SURG (BLADE) ×2 IMPLANT
CANISTER SUCT 3000ML PPV (MISCELLANEOUS) ×4 IMPLANT
CHLORAPREP W/TINT 26ML (MISCELLANEOUS) ×2 IMPLANT
COVER SURGICAL LIGHT HANDLE (MISCELLANEOUS) ×4 IMPLANT
COVER WAND RF STERILE (DRAPES) ×2 IMPLANT
DRAIN CHANNEL 19F RND (DRAIN) ×2 IMPLANT
DRAPE LAPAROSCOPIC ABDOMINAL (DRAPES) ×2 IMPLANT
DRAPE WARM FLUID 44X44 (DRAPE) ×2 IMPLANT
DRSG OPSITE POSTOP 4X10 (GAUZE/BANDAGES/DRESSINGS) ×4 IMPLANT
DRSG TEGADERM 2-3/8X2-3/4 SM (GAUZE/BANDAGES/DRESSINGS) ×2 IMPLANT
ELECT BLADE 6.5 EXT (BLADE) ×2 IMPLANT
ELECT CAUTERY BLADE 6.4 (BLADE) ×2 IMPLANT
ELECT REM PT RETURN 9FT ADLT (ELECTROSURGICAL) ×2
ELECTRODE REM PT RTRN 9FT ADLT (ELECTROSURGICAL) ×1 IMPLANT
GLOVE BIO SURGEON STRL SZ 6 (GLOVE) ×2 IMPLANT
GLOVE INDICATOR 6.5 STRL GRN (GLOVE) ×2 IMPLANT
GOWN STRL REUS W/ TWL LRG LVL3 (GOWN DISPOSABLE) ×2 IMPLANT
GOWN STRL REUS W/TWL LRG LVL3 (GOWN DISPOSABLE) ×2
HEMOSTAT SNOW SURGICEL 2X4 (HEMOSTASIS) ×2 IMPLANT
KIT BASIN OR (CUSTOM PROCEDURE TRAY) ×4 IMPLANT
KIT TURNOVER KIT B (KITS) ×2 IMPLANT
LIGASURE IMPACT 36 18CM CVD LR (INSTRUMENTS) IMPLANT
NS IRRIG 1000ML POUR BTL (IV SOLUTION) ×6 IMPLANT
PACK GENERAL/GYN (CUSTOM PROCEDURE TRAY) ×2 IMPLANT
PAD ARMBOARD 7.5X6 YLW CONV (MISCELLANEOUS) ×4 IMPLANT
PENCIL SMOKE EVACUATOR (MISCELLANEOUS) ×2 IMPLANT
SPONGE LAP 18X18 X RAY DECT (DISPOSABLE) ×2 IMPLANT
STAPLER VISISTAT 35W (STAPLE) ×2 IMPLANT
SUCTION POOLE TIP (SUCTIONS) ×2 IMPLANT
SUT ETHILON 2 0 FS 18 (SUTURE) ×2 IMPLANT
SUT PDS AB 1 TP1 96 (SUTURE) ×4 IMPLANT
SUT SILK 2 0 SH CR/8 (SUTURE) ×2 IMPLANT
SUT SILK 2 0 TIES 10X30 (SUTURE) ×2 IMPLANT
SUT SILK 3 0 SH CR/8 (SUTURE) ×2 IMPLANT
SUT SILK 3 0 TIES 10X30 (SUTURE) ×2 IMPLANT
SUT VIC AB 3-0 SH 18 (SUTURE) IMPLANT
TOWEL GREEN STERILE (TOWEL DISPOSABLE) ×2 IMPLANT
TRAY FOLEY MTR SLVR 16FR STAT (SET/KITS/TRAYS/PACK) ×2 IMPLANT
TUBE CONNECTING 12X1/4 (SUCTIONS) ×4 IMPLANT
YANKAUER SUCT BULB TIP NO VENT (SUCTIONS) ×4 IMPLANT

## 2018-03-27 NOTE — ED Notes (Signed)
Called main lab to follow up on blood work. Lab states they have blood work and will be processing shortly.

## 2018-03-27 NOTE — Progress Notes (Signed)
Pharmacy Antibiotic Note  Johnny Lynn is a 32 y.o. male admitted on 03/27/2018 with intra abdominal infection.  Pharmacy has been consulted for zosyn dosing.  Plan: Zosyn 3.375g IV q8h (4 hour infusion).     Temp (24hrs), Avg:97.8 F (36.6 C), Min:97.8 F (36.6 C), Max:97.8 F (36.6 C)  Recent Labs  Lab 03/24/18 1841 03/27/18 1847  WBC 6.7 13.2*  CREATININE 1.17 1.25*    CrCl cannot be calculated (Unknown ideal weight.).    Allergies  Allergen Reactions  . Reglan [Metoclopramide] Anxiety    Very anxious    Thank you for allowing pharmacy to be a part of this patient's care.  Talbert Cage Poteet 03/27/2018 11:21 PM

## 2018-03-27 NOTE — H&P (Addendum)
Surgical H&P  CC: abdominal pain  HPI: Very nice 32yo man who presents with abdominal pain. He began having pain Wednesday and was seen in the ER, discharged. He had emesis all night and continued pain. Thursday was OK, but today he was in [employer] sitting and waiting for a job interview when he experienced a popping sensation in the mid-abdomen and immediate, unrelenting pain. Aggravated by any movement. Associated with nausea.  He has a history of bleeding ulcer treated endoscopically in [place] in 2017. No abdominal surgery.  He is expecting a baby in February. He works as a Scientist, forensic.  Labs show leukocytosis, mild AKI, UDS positive for amphetamines, cocaine, THC.  Allergies  Allergen Reactions  . Reglan [Metoclopramide] Anxiety    Very anxious    Past Medical History:  Diagnosis Date  . Peptic ulcer     No prior surgery. Had endoscopic treatment of bleeding ulcer in 2017 in [place].   No family history on file.  Social History   Socioeconomic History  . Marital status: Single    Spouse name: Not on file  . Number of children: Not on file  . Years of education: Not on file  . Highest education level: Not on file  Occupational History  . Not on file  Social Needs  . Financial resource strain: Not on file  . Food insecurity:    Worry: Not on file    Inability: Not on file  . Transportation needs:    Medical: Not on file    Non-medical: Not on file  Tobacco Use  . Smoking status: Former Games developer  . Smokeless tobacco: Never Used  Substance and Sexual Activity  . Alcohol use: Never    Frequency: Never  . Drug use: Yes    Types: Marijuana, Cocaine, Methamphetamines  . Sexual activity: Not on file  Lifestyle  . Physical activity:    Days per week: Not on file    Minutes per session: Not on file  . Stress: Not on file  Relationships  . Social connections:    Talks on phone: Not on file    Gets together: Not on file    Attends religious service: Not on file    Active member  of club or organization: Not on file    Attends meetings of clubs or organizations: Not on file    Relationship status: Not on file  Other Topics Concern  . Not on file  Social History Narrative  . Not on file    No current facility-administered medications on file prior to encounter.    Current Outpatient Medications on File Prior to Encounter  Medication Sig Dispense Refill  . cephALEXin (KEFLEX) 500 MG capsule Take 1 capsule (500 mg total) by mouth 4 (four) times daily. (Patient not taking: Reported on 03/24/2018) 28 capsule 0  . dicyclomine (BENTYL) 20 MG tablet Take 1 tablet (20 mg total) by mouth 2 (two) times daily. 20 tablet 0  . doxycycline (VIBRAMYCIN) 100 MG capsule Take 1 capsule (100 mg total) by mouth 2 (two) times daily for 7 days. 14 capsule 0  . naproxen sodium (ALEVE) 220 MG tablet Take 220 mg by mouth 2 (two) times daily as needed (for pain).    . ondansetron (ZOFRAN ODT) 4 MG disintegrating tablet Take 1 tablet (4 mg total) by mouth every 8 (eight) hours as needed for nausea. 10 tablet 0  . pantoprazole (PROTONIX) 40 MG tablet Take 1 tablet (40 mg total) by mouth daily for  14 days. 14 tablet 0    Review of Systems: a complete, 10pt review of systems was completed with pertinent positives and negatives as documented in the HPI  Physical Exam: Vitals:   03/27/18 2030 03/27/18 2231  BP: 140/84 (!) 165/84  Pulse: (!) 114 (!) 117  Resp: (!) 23 18  Temp:    SpO2: 98% 98%   Gen: A&Ox3, cooperative but clearly uncomfortable, motionless  Head: normocephalic, atraumatic Eyes: extraocular motions intact, anicteric.  Neck: supple without mass or thyromegaly Chest: unlabored respirations, symmetrical air entry, clear bilaterally   Cardiovascular: mildly tachycardic, regular, with palpable distal pulses, no pedal edema Abdomen: rigid and tender, peritonitic. No obvious hernia or mass Extremities: warm, without edema, no deformities  Neuro: grossly intact Psych:  appropriate mood and affect, normal insight  Skin: warm and dry   CBC Latest Ref Rng & Units 03/27/2018 03/24/2018 02/26/2018  WBC 4.0 - 10.5 K/uL 13.2(H) 6.7 7.1  Hemoglobin 13.0 - 17.0 g/dL 17.3(H) 15.5 14.5  Hematocrit 39.0 - 52.0 % 52.9(H) 46.4 42.7  Platelets 150 - 400 K/uL 261 229 172    CMP Latest Ref Rng & Units 03/27/2018 03/24/2018 02/26/2018  Glucose 70 - 99 mg/dL 99 74 161(W)  BUN 6 - 20 mg/dL 12 9 8   Creatinine 0.61 - 1.24 mg/dL 9.60(A) 5.40 9.81(X)  Sodium 135 - 145 mmol/L 139 141 142  Potassium 3.5 - 5.1 mmol/L 3.9 3.8 3.3(L)  Chloride 98 - 111 mmol/L 101 103 105  CO2 22 - 32 mmol/L 25 26 28   Calcium 8.9 - 10.3 mg/dL 9.7 9.7 9.2  Total Protein 6.5 - 8.1 g/dL 7.0 6.7 6.0(L)  Total Bilirubin 0.3 - 1.2 mg/dL 9.1(Y) 0.8 0.7  Alkaline Phos 38 - 126 U/L 45 49 47  AST 15 - 41 U/L 26 21 18   ALT 0 - 44 U/L 18 24 18     No results found for: INR, PROTIME  Imaging: Ct Abdomen Pelvis W Contrast  Result Date: 03/27/2018 CLINICAL DATA:  Lower abdominal pain with increased severity. Difficulty voiding. History of gastric ulcers. EXAM: CT ABDOMEN AND PELVIS WITH CONTRAST TECHNIQUE: Multidetector CT imaging of the abdomen and pelvis was performed using the standard protocol following bolus administration of intravenous contrast. CONTRAST:  OMNIPAQUE IOHEXOL 300 MG/ML  SOLN COMPARISON:  02/27/2018 CT FINDINGS: Lower chest: Normal heart size.  Clear lung bases. Hepatobiliary: Homogeneous attenuation of the liver without space-occupying mass. No biliary dilatation. The gallbladder is physiologically distended without stones. Pancreas: Unremarkable Spleen: Normal size. Adrenals/Urinary Tract: Normal bilateral adrenal glands. Punctate calculi in the right kidney without obstructive uropathy. The urinary bladder is unremarkable for the degree of distention. Stomach/Bowel: Physiologic distention of the stomach. There is free air in the upper abdomen along the falciform ligament and  subdiaphragmatic in etiology consistent with perforated hollow viscus. Small to moderate amount of free intraperitoneal fluid is noted with sympathetic dilatation of small bowel likely representing small bowel ileus. The potential for peritonitis is of concern. What appears to represent the appendix is normal. The colon is decompressed in appearance. Vascular/Lymphatic: No significant vascular findings are present. No enlarged abdominal or pelvic lymph nodes. Reproductive: Prostate is unremarkable. Other: Small to moderate amount of free fluid is described. Small amount of free air in the upper abdomen. 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 No aggressive nor acute osseous abnormality. IMPRESSION: 1. A few dots of free intraperitoneal air noted beneath the diaphragm and along the falciform ligament. Interval development of a small to moderate amount  of free fluid within the abdomen and pelvis. Findings would be in keeping with perforated hollow viscus. Exact site unfortunately is not identified but given history of gastric ulcers, would consider starting there initially unless clinical exam dictates otherwise. This would also corroborated with air mostly seen in the upper abdomen though this may simply be due to patient being mostly upright prior to imaging. 2. Concern for peritonitis given presence of small to moderate free fluid within the abdomen and pelvis. 3. Probable sympathetic ileus causing dilated fluid-filled small bowel without evidence of mechanical bowel obstruction. These results were called by telephone at the time of interpretation on 03/27/2018 at 10:15 pm to PA [facility] , who verbally acknowledged these results. Electronically Signed   By: Tollie Eth M.D.   On: 03/27/2018 22:15     A/P: Perforated viscus. Likely PUD given history, he does also report use of nsaids/goodys etc. Ex lap now for washout, repair, possible bowel resection, possible open abdomen. Risks include bleeding,  pain, infection, scarring, injury to intraabdominal structures, repair failure or leak, ileus, adhesions/bowel obstruction, incisional hernia, prolonged hospitalization or recovery, hypertensive crisis or acute cardiac event, DVT/PE, pneumonia, stroke, death. Admit post-op   Phylliss Blakes, MD [facility], [place] Pager [phone]

## 2018-03-27 NOTE — ED Triage Notes (Signed)
Pt arrived gEMS from [facility] c/o sudden onset abdominal pain that started this afternoon. Pt felt a "pop" near umbilicus area followed by constant pain. Pt has hx of stomach ulcers and takes pepcid and protonix. Pt is alert and oriented x4. EMS v/s: 160/100, hr 128. No medication given PTA.

## 2018-03-27 NOTE — ED Provider Notes (Signed)
32 year old male received at sign out from [place] [facility] pending general surgery consult. Per her HPI:  "32 y.o male with a PMH of Bleeding ulcers presents to the ED via EMS with a chief complaint of abdominal pain x couple of hours ago. Patient reports he was at [employer] when he felt a sudden pop on his stomach, he reports feeling nauseated. He describes the pain as sharp located in the middle of his abdomen at his umbilicus. 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 He states he has cut down on smoking marijuana since. He also reports when feeling this abdominal pain he takes he could not move as this made it worse. He has tried pepcid and OTC medications for his abdominal pain but reports no relieve in symptoms. He denies any fever, emesis, diarrhea or blood in his stool."   He reports previous peptic ulcers were treated in [place].  He has not followed with GI or surgery since relocating to [place].  Last peptic ulcer was in 2017.  Physical Exam  BP 126/68 (BP Location: Left Arm)   Pulse 88   Temp 97.6 F (36.4 C) (Oral)   Resp 18   SpO2 97%   Physical Exam  Uncomfortable appearing.  Tachycardic.  Diffusely tender throughout the abdomen with minimal palpation with rebound and guarding. However, the patient endorses more severe pain with palpation in the epigastric and right upper quadrant.  ED Course/Procedures   Clinical Course as of Mar 29 535  Fri Mar 27, 2018  2223 Discussed CT findings with the patient.  Has been n.p.o. since 1600.  He is still in a considerable amount of pain.  Will order Dilaudid.   [MM]  2227 Spoke with Dr. Fredricka Bonine, general surgery, who came and evaluated the patient and plans to take the patient to the OR shortly. Zosyn initiated.    [MM]    Clinical Course User Index [MM] Mallissa Lorenzen A, PA-C    .Critical Care Performed by: Barkley Boards, PA-C Authorized by: Barkley Boards,  PA-C   Critical care provider statement:    Critical care time (minutes):  35   Critical care time was exclusive of:  Separately billable procedures and treating other patients and teaching time   Critical care was necessary to treat or prevent imminent or life-threatening deterioration of the following conditions:  Sepsis   Critical care was time spent personally by me on the following activities:  Discussions with consultants, re-evaluation of patient's condition, obtaining history from patient or surrogate, examination of patient, evaluation of patient's response to treatment and development of treatment plan with patient or surrogate   I assumed direction of critical care for this patient from another provider in my specialty: yes      MDM    32 year old male with a h/o of Peptic ulcers who presents to the Emergency Department with abdominal pain pending CT abdomen pelvis.  CT abdomen pelvis concerning for peritonitis with perforated hollow viscus and a few dots of free intraperitoneal air beneath the diaphragm and along the falciform ligament.   On reevaluation, the patient appears very uncomfortable and abdomen is markedly tender with rebound and guarding, consistent with a surgical abdomen.  Dilaudid ordered for pain control.  Zosyn initiated per pharmacy.  Consulted general surgery and spoke with Dr. Fredricka Bonine who plans to evaluate the patient.  After her evaluation, she states that she plans to take the  patient to the OR tonight and admit.   Discussed the plan with the patient and all questions were answered at that time. The patient appears reasonably stabilized for admission considering the current resources, flow, and capabilities available in the ED at this time, and I doubt any other [facility] requiring further screening and/or treatment in the ED prior to admission.        Barkley Boards, PA-C 03/28/18 0536    Benjiman Core, MD 03/28/18 [phone]

## 2018-03-27 NOTE — ED Provider Notes (Addendum)
[facility] EMERGENCY DEPARTMENT Provider Note   CSN: [id number] Arrival date & time: 03/27/18  1720     History   Chief Complaint Chief Complaint  Patient presents with  . Abdominal Pain    HPI Paula Zietz is a 32 y.o. male.  32 y.o male with a PMH of Bleeding ulcers presents to the ED via EMS with a chief complaint of abdominal pain x couple of hours ago. Patient reports he was at [employer] when he felt a sudden pop on his stomach, he reports feeling nauseated. He describes the pain as sharp located in the middle of his abdomen at his umbilicus. 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 He states he has cut down on smoking marijuana since. He also reports when feeling this abdominal pain he takes he could not move as this made it worse. He has tried pepcid and OTC medications for his abdominal pain but reports no relieve in symptoms. He denies any fever, emesis, diarrhea or blood in his stool.      Past Medical History:  Diagnosis Date  . Peptic ulcer     Patient Active Problem List   Diagnosis Date Noted  . Perforated viscus 03/28/2018    Past Surgical History:  Procedure Laterality Date  . ABDOMINAL SURGERY    . GREATER OMENTAL FLAP CLOSURE N/A 03/27/2018   Procedure: OMENTAL PEDICAL FLAP REPAIR OF ULCER;  Surgeon: Berna Bue, MD;  Location: [facility] OR;  Service: General;  Laterality: N/A;  . LAPAROTOMY N/A 03/27/2018   Procedure: EXPLORATORY LAPAROTOMY;  Surgeon: Berna Bue, MD;  Location: [facility] OR;  Service: General;  Laterality: N/A;        Home Medications    Prior to Admission medications   Not on File    Family History History reviewed. No pertinent family history.  Social History Social History   Tobacco Use  . Smoking status: Former Games developer  . Smokeless tobacco: Never Used  Substance Use Topics  . Alcohol use: Never    Frequency: Never  . Drug  use: Yes    Types: Marijuana, Cocaine, Methamphetamines     Allergies   Reglan [metoclopramide]   Review of Systems Review of Systems  Constitutional: Negative for chills and fever.  HENT: Negative for sore throat.   Respiratory: Negative for shortness of breath.   Cardiovascular: Negative for chest pain.  Gastrointestinal: Positive for abdominal pain and nausea. Negative for diarrhea and vomiting.  Genitourinary: Negative for dysuria and flank pain.  Musculoskeletal: Negative for back pain.  Skin: Negative for pallor and wound.  Neurological: Negative for light-headedness and headaches.  All other systems reviewed and are negative.    Physical Exam Updated Vital Signs BP 131/68   Pulse 70   Temp 98.3 F (36.8 C) (Oral)   Resp 17   Ht 5' 7.5" (1.715 m) Comment: pt report  Wt 77.2 kg Comment: Bed weight  SpO2 98%   BMI 26.26 kg/m   Physical Exam  Constitutional: He is oriented to person, place, and time. He appears well-developed and well-nourished.  HENT:  Head: Normocephalic and atraumatic.  Cardiovascular:  Tachycardic upon arrival, reporting pain.  Pulmonary/Chest: No respiratory distress. He has no wheezes.  Abdominal: Normal appearance. Bowel sounds are decreased. There is tenderness in the periumbilical area. There is rigidity and guarding. There is no rebound and no CVA tenderness.  Neurological: He is alert and oriented to  person, place, and time.  Skin: Skin is warm and dry.  Nursing note and vitals reviewed.    ED Treatments / Results  Labs (all labs ordered are listed, but only abnormal results are displayed) Labs Reviewed  CBC WITH DIFFERENTIAL/PLATELET - Abnormal; Notable for the following components:      Result Value   WBC 13.2 (*)    Hemoglobin 17.3 (*)    HCT 52.9 (*)    Neutro Abs 11.5 (*)    All other components within normal limits  COMPREHENSIVE METABOLIC PANEL - Abnormal; Notable for the following components:   Creatinine, Ser 1.25  (*)    Total Bilirubin 1.6 (*)    All other components within normal limits  URINALYSIS, ROUTINE W REFLEX MICROSCOPIC - Abnormal; Notable for the following components:   Hgb urine dipstick SMALL (*)    Ketones, ur 80 (*)    Bacteria, UA RARE (*)    All other components within normal limits  RAPID URINE DRUG SCREEN, HOSP PERFORMED - Abnormal; Notable for the following components:   Cocaine POSITIVE (*)    Amphetamines POSITIVE (*)    Tetrahydrocannabinol POSITIVE (*)    All other components within normal limits  MAGNESIUM - Abnormal; Notable for the following components:   Magnesium 1.5 (*)    All other components within normal limits  CBC - Abnormal; Notable for the following components:   WBC 17.3 (*)    All other components within normal limits  BASIC METABOLIC PANEL - Abnormal; Notable for the following components:   Glucose, Bld 122 (*)    Calcium 8.7 (*)    All other components within normal limits  BASIC METABOLIC PANEL - Abnormal; Notable for the following components:   Calcium 8.8 (*)    All other components within normal limits  H PYLORI, IGM, IGG, IGA AB - Abnormal; Notable for the following components:   H Pylori IgG 5.30 (*)    All other components within normal limits  LIPASE, BLOOD  CBC  TROPONIN I  TROPONIN I  TROPONIN I    EKG None  Radiology No results found.  Procedures Procedures (including critical care time)  Medications Ordered in ED Medications  bisacodyl (DULCOLAX) suppository 10 mg (has no administration in time range)  ondansetron (ZOFRAN-ODT) disintegrating tablet 4 mg (has no administration in time range)    Or  ondansetron (ZOFRAN) injection 4 mg (has no administration in time range)  hydrALAZINE (APRESOLINE) injection 10 mg (has no administration in time range)  piperacillin-tazobactam (ZOSYN) IVPB 3.375 g (3.375 g Intravenous New Bag/Given 04/02/18 2121)  cefOXitin (MEFOXIN) 1-4 GM-%(50ML) IVPB (has no administration in time range)    nicotine (NICODERM CQ - dosed in mg/24 hr) patch 7 mg (7 mg Transdermal Not Given 04/02/18 0932)  pantoprazole (PROTONIX) 80 mg in sodium chloride 0.9 % 250 mL (0.32 mg/mL) infusion (8 mg/hr Intravenous New Bag/Given 03/31/18 0538)  phenol (CHLORASEPTIC) mouth spray 1 spray (has no administration in time range)  enoxaparin (LOVENOX) injection 40 mg (40 mg Subcutaneous Not Given 04/02/18 1343)  HYDROmorphone (DILAUDID) injection 0.5 mg (0.5 mg Intravenous Given 04/01/18 0802)  multivitamin with minerals tablet 1 tablet (1 tablet Oral Given 04/02/18 0938)  feeding supplement (BOOST / RESOURCE BREEZE) liquid 1 Container (1 Container Oral Given 04/02/18 1944)  pantoprazole (PROTONIX) EC tablet 40 mg (40 mg Oral Given 04/02/18 2120)  acetaminophen (TYLENOL) tablet 650 mg (650 mg Oral Given 04/02/18 2120)  methocarbamol (ROBAXIN) tablet 750 mg (750 mg Oral Given  04/02/18 2120)  docusate sodium (COLACE) capsule 100 mg (100 mg Oral Given 04/02/18 2120)  traMADol (ULTRAM) tablet 50-100 mg (100 mg Oral Given 04/02/18 1541)  sodium chloride 0.9 % bolus 1,000 mL (0 mLs Intravenous Stopped 03/27/18 2028)  dicyclomine (BENTYL) injection 20 mg (20 mg Intramuscular Given 03/27/18 1928)  sodium chloride 0.9 % bolus 1,000 mL (0 mLs Intravenous Stopped 03/27/18 2203)  fentaNYL (SUBLIMAZE) injection 25 mcg (25 mcg Intravenous Given 03/27/18 2051)  iohexol (OMNIPAQUE) 300 MG/ML solution 100 mL (100 mLs Intravenous Contrast Given 03/27/18 2145)  HYDROmorphone (DILAUDID) injection 1 mg (1 mg Intravenous Given 03/27/18 2232)  acetaminophen (OFIRMEV) IV 1,000 mg (0 mg Intravenous Stopped 03/28/18 1915)  pantoprazole (PROTONIX) 80 mg in sodium chloride 0.9 % 100 mL IVPB ( Intravenous Stopped 03/28/18 1549)  iohexol (OMNIPAQUE) 300 MG/ML solution 150 mL (120 mLs Oral Contrast Given 03/31/18 1240)     Initial Impression / Assessment and Plan / ED Course  I have reviewed the triage vital signs and the nursing  notes.  Pertinent labs & imaging results that were available during my care of the patient were reviewed by me and considered in my medical decision making (see chart for details).  Clinical Course as of Apr 04 39  Fri Mar 27, 2018  2223 Discussed CT findings with the patient.  Has been n.p.o. since 1600.  He is still in a considerable amount of pain.  Will order Dilaudid.   [MM]  2227 Spoke with Dr. Fredricka Bonine, general surgery, who came and evaluated the patient and plans to take the patient to the OR shortly. Zosyn initiated.    [MM]    Clinical Course User Index [MM] Frederik Pear A, PA-C  Patient presents abdominal pain which has worsen since his last visit on 10/15. Patient had a negative CT on 09/05 but his CBC today showed a double WBC count at 13.2. Patient is unsteady unable to lay flat as he reports pain has worsening. He was tachycardic upon arrival, I have provided him with a bolus of fluids, his tachycardia has persisted.CT abdomen and pelvis pending.  10:14 PM Spoke to radiologist who advised patient has a perforated viscus and large amount of free air, especially on upper abdomen. Patient care transferred to [facility] at shift change.     Final Clinical Impressions(s) / ED Diagnoses   Final diagnoses:  Epigastric pain  Perforated viscus  Pain with swallowing  Perforated ulcer [facility])    ED Discharge Orders    None       Claude Manges, PA-C 03/27/18 2216    Claude Manges, PA-C 03/27/18 2216    Mancel Bale, MD 03/27/18 2237    Claude Manges, PA-C 04/03/18 0040    Mancel Bale, MD 04/03/18 1047

## 2018-03-27 NOTE — ED Notes (Signed)
Pt called out stating he is in pain still. EDP is aware. No pain medicine until CT scan results are back.

## 2018-03-27 NOTE — Anesthesia Procedure Notes (Signed)
Procedure Name: Intubation Date/Time: 03/27/2018 11:40 PM Performed by: Molli Hazard, CRNA Pre-anesthesia Checklist: Patient identified, Emergency Drugs available, Suction available and Patient being monitored Patient Re-evaluated:Patient Re-evaluated prior to induction Oxygen Delivery Method: Circle system utilized Preoxygenation: Pre-oxygenation with 100% oxygen Induction Type: IV induction, Rapid sequence and Cricoid Pressure applied Laryngoscope Size: Miller and 2 Grade View: Grade I Tube type: Oral Tube size: 7.5 mm Number of attempts: 1 Airway Equipment and Method: Stylet Placement Confirmation: ETT inserted through vocal cords under direct vision,  positive ETCO2 and breath sounds checked- equal and bilateral Secured at: 23 cm Tube secured with: Tape Dental Injury: Teeth and Oropharynx as per pre-operative assessment

## 2018-03-27 NOTE — Anesthesia Preprocedure Evaluation (Signed)
Anesthesia Evaluation  Patient identified by MRN, date of birth, ID band Patient awake    Reviewed: Allergy & Precautions, NPO status , Patient's Chart, lab work & pertinent test results  Airway Mallampati: II  TM Distance: >3 FB Neck ROM: Full    Dental no notable dental hx.    Pulmonary neg pulmonary ROS, former smoker,    Pulmonary exam normal breath sounds clear to auscultation       Cardiovascular negative cardio ROS   Rhythm:Regular Rate:Tachycardia     Neuro/Psych negative neurological ROS  negative psych ROS   GI/Hepatic PUD, (+)     substance abuse  cocaine use, marijuana use and methamphetamine use,   Endo/Other  negative endocrine ROS  Renal/GU negative Renal ROS  negative genitourinary   Musculoskeletal negative musculoskeletal ROS (+)   Abdominal   Peds negative pediatric ROS (+)  Hematology negative hematology ROS (+)   Anesthesia Other Findings   Reproductive/Obstetrics negative OB ROS                             Anesthesia Physical Anesthesia Plan  ASA: III and emergent  Anesthesia Plan: General   Post-op Pain Management:    Induction: Intravenous and Rapid sequence  PONV Risk Score and Plan: 2 and Ondansetron, Dexamethasone and Treatment may vary due to age or medical condition  Airway Management Planned: Oral ETT  Additional Equipment:   Intra-op Plan:   Post-operative Plan: Extubation in OR  Informed Consent: I have reviewed the patients History and Physical, chart, labs and discussed the procedure including the risks, benefits and alternatives for the proposed anesthesia with the patient or authorized representative who has indicated his/her understanding and acceptance.   Dental advisory given  Plan Discussed with: CRNA and Surgeon  Anesthesia Plan Comments:         Anesthesia Quick Evaluation

## 2018-03-27 NOTE — ED Provider Notes (Signed)
  Face-to-face evaluation   History: Abdominal pain, suddenly worsened today.  Similar pain several days ago when evaluated in the ED.  Pain worse when supine.  Physical exam: Alert, calm, cooperative.  Chest nontender to palpation.  No respiratory distress.  Abdomen exquisite tenderness upper.  Medical screening examination/treatment/procedure(s) were conducted as a shared visit with non-physician practitioner(s) and myself.  I personally evaluated the patient during the encounter    Mancel Bale, MD 03/27/18 2237

## 2018-03-28 ENCOUNTER — Encounter (HOSPITAL_COMMUNITY): Payer: Self-pay | Admitting: *Deleted

## 2018-03-28 DIAGNOSIS — R198 Other specified symptoms and signs involving the digestive system and abdomen: Secondary | ICD-10-CM | POA: Diagnosis present

## 2018-03-28 LAB — CBC
HCT: 43.4 % (ref 39.0–52.0)
Hemoglobin: 14.4 g/dL (ref 13.0–17.0)
MCH: 29.6 pg (ref 26.0–34.0)
MCHC: 33.2 g/dL (ref 30.0–36.0)
MCV: 89.3 fL (ref 80.0–100.0)
Platelets: 221 10*3/uL (ref 150–400)
RBC: 4.86 MIL/uL (ref 4.22–5.81)
RDW: 12.8 % (ref 11.5–15.5)
WBC: 17.3 10*3/uL — ABNORMAL HIGH (ref 4.0–10.5)
nRBC: 0 % (ref 0.0–0.2)

## 2018-03-28 LAB — BASIC METABOLIC PANEL
Anion gap: 7 (ref 5–15)
BUN: 10 mg/dL (ref 6–20)
CO2: 25 mmol/L (ref 22–32)
Calcium: 8.7 mg/dL — ABNORMAL LOW (ref 8.9–10.3)
Chloride: 103 mmol/L (ref 98–111)
Creatinine, Ser: 1.15 mg/dL (ref 0.61–1.24)
GFR calc Af Amer: >60 mL/min (ref >60)
GFR calc non Af Amer: >60 mL/min (ref >60)
Glucose, Bld: 122 mg/dL — ABNORMAL HIGH (ref 70–99)
Potassium: 4.2 mmol/L (ref 3.5–5.1)
Sodium: 135 mmol/L (ref 135–145)

## 2018-03-28 LAB — MAGNESIUM: Magnesium: 1.5 mg/dL — ABNORMAL LOW (ref 1.7–2.4)

## 2018-03-28 MED ORDER — HYDROMORPHONE HCL 1 MG/ML IJ SOLN
INTRAMUSCULAR | Status: AC
  Administered 2018-03-28: 0.5 mg via INTRAVENOUS
  Filled 2018-03-28: qty 1

## 2018-03-28 MED ORDER — SODIUM CHLORIDE 0.9 % IV SOLN
80.0000 mg | Freq: Once | INTRAVENOUS | Status: AC
  Administered 2018-03-28: 80 mg via INTRAVENOUS
  Filled 2018-03-28: qty 80

## 2018-03-28 MED ORDER — ONDANSETRON HCL 4 MG/2ML IJ SOLN
INTRAMUSCULAR | Status: AC
  Filled 2018-03-28: qty 2

## 2018-03-28 MED ORDER — DEXAMETHASONE SODIUM PHOSPHATE 10 MG/ML IJ SOLN
INTRAMUSCULAR | Status: DC | PRN
  Administered 2018-03-28: 10 mg via INTRAVENOUS

## 2018-03-28 MED ORDER — PANTOPRAZOLE SODIUM 40 MG IV SOLR
40.0000 mg | Freq: Two times a day (BID) | INTRAVENOUS | Status: DC
  Administered 2018-04-01: 40 mg via INTRAVENOUS
  Filled 2018-03-28: qty 40

## 2018-03-28 MED ORDER — SODIUM CHLORIDE 0.9 % IV SOLN
8.0000 mg/h | INTRAVENOUS | Status: AC
  Administered 2018-03-28 – 2018-03-31 (×6): 8 mg/h via INTRAVENOUS
  Filled 2018-03-28 (×10): qty 80

## 2018-03-28 MED ORDER — ACETAMINOPHEN 10 MG/ML IV SOLN
1000.0000 mg | Freq: Four times a day (QID) | INTRAVENOUS | Status: AC
  Administered 2018-03-28 (×4): 1000 mg via INTRAVENOUS
  Filled 2018-03-28 (×3): qty 100

## 2018-03-28 MED ORDER — HEMOSTATIC AGENTS (NO CHARGE) OPTIME
TOPICAL | Status: DC | PRN
  Administered 2018-03-28: 1 via TOPICAL

## 2018-03-28 MED ORDER — NICOTINE 7 MG/24HR TD PT24
7.0000 mg | MEDICATED_PATCH | Freq: Every day | TRANSDERMAL | Status: DC
  Filled 2018-03-28 (×5): qty 1

## 2018-03-28 MED ORDER — ONDANSETRON 4 MG PO TBDP: 4.0000 mg | ORAL_TABLET | Freq: Four times a day (QID) | ORAL | Status: DC | PRN

## 2018-03-28 MED ORDER — BOOST / RESOURCE BREEZE PO LIQD CUSTOM: 1.0000 | Freq: Three times a day (TID) | ORAL | Status: DC

## 2018-03-28 MED ORDER — ONDANSETRON HCL 4 MG/2ML IJ SOLN
INTRAMUSCULAR | Status: DC | PRN
  Administered 2018-03-28: 4 mg via INTRAVENOUS

## 2018-03-28 MED ORDER — ONDANSETRON HCL 4 MG/2ML IJ SOLN: 4.0000 mg | Freq: Four times a day (QID) | INTRAMUSCULAR | Status: DC | PRN

## 2018-03-28 MED ORDER — FAMOTIDINE IN NACL 20-0.9 MG/50ML-% IV SOLN
20.0000 mg | Freq: Two times a day (BID) | INTRAVENOUS | Status: DC
  Administered 2018-03-28 – 2018-04-01 (×10): 20 mg via INTRAVENOUS
  Filled 2018-03-28 (×10): qty 50

## 2018-03-28 MED ORDER — ENSURE SURGERY PO LIQD
237.0000 mL | Freq: Two times a day (BID) | ORAL | Status: DC
  Filled 2018-03-28 (×2): qty 237

## 2018-03-28 MED ORDER — HEPARIN SODIUM (PORCINE) 5000 UNIT/ML IJ SOLN
5000.0000 [IU] | Freq: Three times a day (TID) | INTRAMUSCULAR | Status: DC
  Administered 2018-03-28 – 2018-03-30 (×7): 5000 [IU] via SUBCUTANEOUS
  Filled 2018-03-28 (×7): qty 1

## 2018-03-28 MED ORDER — HYDROMORPHONE HCL 1 MG/ML IJ SOLN
0.2500 mg | INTRAMUSCULAR | Status: DC | PRN
  Administered 2018-03-28 (×2): 0.5 mg via INTRAVENOUS

## 2018-03-28 MED ORDER — METHOCARBAMOL 1000 MG/10ML IJ SOLN
500.0000 mg | Freq: Four times a day (QID) | INTRAVENOUS | Status: DC | PRN
  Administered 2018-03-28 (×2): 500 mg via INTRAVENOUS
  Filled 2018-03-28 (×2): qty 5

## 2018-03-28 MED ORDER — HYDROMORPHONE HCL 1 MG/ML IJ SOLN
0.5000 mg | INTRAMUSCULAR | Status: DC | PRN
  Administered 2018-03-28 – 2018-03-31 (×19): 1 mg via INTRAVENOUS
  Filled 2018-03-28 (×20): qty 1

## 2018-03-28 MED ORDER — HYDRALAZINE HCL 20 MG/ML IJ SOLN
10.0000 mg | INTRAMUSCULAR | Status: DC | PRN
  Filled 2018-03-28: qty 0.5

## 2018-03-28 MED ORDER — ACETAMINOPHEN 10 MG/ML IV SOLN
INTRAVENOUS | Status: AC
  Filled 2018-03-28: qty 100

## 2018-03-28 MED ORDER — PROMETHAZINE HCL 25 MG/ML IJ SOLN: 6.2500 mg | INTRAMUSCULAR | Status: DC | PRN

## 2018-03-28 MED ORDER — SODIUM CHLORIDE 0.9 % IV SOLN
INTRAVENOUS | Status: DC
  Administered 2018-03-28: 08:00:00 via INTRAVENOUS
  Administered 2018-03-28: 125 mL/h via INTRAVENOUS
  Administered 2018-03-28 – 2018-04-02 (×14): via INTRAVENOUS

## 2018-03-28 MED ORDER — DEXAMETHASONE SODIUM PHOSPHATE 10 MG/ML IJ SOLN
INTRAMUSCULAR | Status: AC
  Filled 2018-03-28: qty 1

## 2018-03-28 MED ORDER — BISACODYL 10 MG RE SUPP: 10.0000 mg | Freq: Every day | RECTAL | Status: DC | PRN

## 2018-03-28 MED ORDER — SUGAMMADEX SODIUM 200 MG/2ML IV SOLN
INTRAVENOUS | Status: DC | PRN
  Administered 2018-03-28: 200 mg via INTRAVENOUS

## 2018-03-28 MED ORDER — 0.9 % SODIUM CHLORIDE (POUR BTL) OPTIME
TOPICAL | Status: DC | PRN
  Administered 2018-03-28 (×6): 1000 mL

## 2018-03-28 NOTE — Op Note (Signed)
Operative Note  Johnny Lynn  [id number]  [id number]  03/28/2018   Surgeon: Leeroy Bock A ConnorMD  Assistant: OR staff  Procedure performed: exploratory laparotomy, omental pedicle flap repair of perforated prepyloric ulcer  Preop diagnosis: perforated viscus Post-op diagnosis/intraop findings: 5 mm, clean edged prepyloric perforation. Moderate amount of purulent ascites. Immediately distal to the perforation the underside of the liver and a small amount of omentum were adherent to the anterior surface of the pylorus and duodenum.  Specimens: no Retained items: 55 French round Blake drain sits over the [place] and exits the right lower quadrant EBL: 30cc Complications: none  Description of procedure: After obtaining informed consent the patient was taken to the operating room and placed supine on operating room table wheregeneral endotracheal anesthesia was initiated, preoperative antibiotics were administered, SCDs applied, and a formal timeout was performed. The abdomen was prepped and draped in usual sterile fashion. A vertical midline incision was made sharply and cautery was used to dissect the soft tissues and divided linea alba. The peritoneal cavity was entered sharply and the incision completed. Initial inspection demonstrated a moderate amount of purulent ascites which was aspirated. The stomach was retracted downward and a clean edged perforation approximately 5 mm in diameter was identified just proximal to the pylorus. The NG tube was palpated in the body of the stomach. Immediately distal to the perforation, the liver and small amount of omentum was densely adherent to the anterior surface of the pylorus and the duodenum. This was gently bluntly dissected however unfortunately the liver capsule did tear before I was able to free up enough space for a Graham patch. A small amount of bleeding from the denuded undersurface of the liver was controlled with cautery  and ultimately Surgicel Snow. 4 sutures of 3-0 silk were placed across the perforation. The patient had scant omentum but a sufficient tongue of well-perfused omental tissue was able to be dissected and brought up to cover the perforation without any tension. This omental pedicle flap was laid over the perforation and the silk sutures were then tied down gently to secure the omental pedicle flap over top of the perforation. At this juncture the remainder of the abdomen was inspected. The transverse colon was lifted and the ligament of Treitz identified. The small bowel was run from this point all the way distally to the cecum and was found to be normal. The appendix appeared normal. The cecum, ascending colon, transverse colon, descending and sigmoid colon as well as the rectum were inspected and all appeared grossly within normal limits with no evidence of other perforation or abnormality. The abdominal cavity was then irrigated with 4 L of warm sterile saline. The effluent was clear. The [name] patch was reinspected and found to be secure over the perforation. A piece of Surgicel [name] was placed on the oozing undersurface of the liver. A 19 French round Blake drain was brought through the abdominal wall in the right lower quadrant and placed over the [name] patch. This was secured the skin with a 2-0 nylon. The abdominal wall was then closed with a running looped #1 PDS starting at either end and tying centrally. The soft tissues were irrigated and hemostasis ensured. The skin was loosely approximated with staples and a honeycomb dressing was applied. The patient was then awakened, extubated and taken to PACU in stable condition.   All counts were correct at the completion of the case.

## 2018-03-28 NOTE — Transfer of Care (Signed)
Immediate Anesthesia Transfer of Care Note  Patient: Johnny Lynn  Procedure(s) Performed: EXPLORATORY LAPAROTOMY (N/A Abdomen) OMENTAL PEDICAL FLAP REPAIR OF ULCER (N/A Abdomen)  Patient Location: PACU  Anesthesia Type:General  Level of Consciousness: awake, alert  and oriented  Airway & Oxygen Therapy: Patient Spontanous Breathing  Post-op Assessment: Report given to RN and Post -op Vital signs reviewed and stable  Post vital signs: Reviewed and stable  Last Vitals:  Vitals Value Taken Time  BP 150/89 03/28/2018  1:04 AM  Temp    Pulse 115 03/28/2018  1:06 AM  Resp 19 03/28/2018  1:06 AM  SpO2 99 % 03/28/2018  1:06 AM  Vitals shown include unvalidated device data.  Last Pain:  Vitals:   03/27/18 2144  TempSrc:   PainSc: 10-Worst pain ever         Complications: No apparent anesthesia complications

## 2018-03-28 NOTE — Plan of Care (Signed)
  Problem: Pain Managment: Goal: General experience of comfort will improve Outcome: Progressing   Problem: Safety: Goal: Ability to remain free from injury will improve Outcome: Progressing   Problem: Skin Integrity: Goal: Risk for impaired skin integrity will decrease Outcome: Progressing   

## 2018-03-28 NOTE — Progress Notes (Signed)
Patient is requesting a Nicotine patch

## 2018-03-28 NOTE — Progress Notes (Signed)
Initial Nutrition Assessment  DOCUMENTATION CODES:  Not applicable  INTERVENTION:  When diet is advanced to CL: Boost Breeze po TID, each supplement provides 250 kcal and 9 grams of protein  Ensure Enlive po BID, each supplement provides 350 kcal and 20 grams of protein  NUTRITION DIAGNOSIS:  Increased nutrient needs related to post-op healing as evidenced by estimated needs  GOAL:  Patient will meet greater than or equal to 90% of their needs  MONITOR:  PO intake, Supplement acceptance, Diet advancement, Labs, Weight trends   REASON FOR ASSESSMENT:  Malnutrition Screening Tool    ASSESSMENT:  32 y/o male Pmhx peptic ulcer. Presented with progressively worsening abdominal pain x3 days. Felt "popping" Friday followed by immense, unrelenting pain and nausea. Imaging showed perforated viscus s/p repair 10/19.    Pt reports he had recently moved down here from [place] after he discovered a local lady would be having his child. He actually was on his way to a job interview to find employement when this event occurred. Since he got down here, he has been staying with friends and family and "eating how they have been eating". He has had trouble adapting to [place] foods/eating habits and notes he hasnt been eating quite as well, though this has not been radically different from his normal. At baseline, he tries to eat "whole foods". He does not take any vitamins or supplements.   Weight wise, he reports his UBW is 170 lbs. He denies any weight changes recently. Bed weight today is exactly 170 lbs  At this time, the patient says he is "extremely hungry". He has surgical pains. He denies any n/v/c/d. He is agreeable to supplements for post-op healing when his diet is advanced.   Labs: wbc:17.3, Glu:122, Mag: 1.5,  Meds: IVF, iv abx, h2ra, Robaxin, PPI, PRn dilaudid  Recent Labs  Lab 03/24/18 1841 03/27/18 1847 03/28/18 0253  NA 141 139 135  K 3.8 3.9 4.2  CL 103 101 103  CO2 26 25 25    BUN 9 12 10   CREATININE 1.17 1.25* 1.15  CALCIUM 9.7 9.7 8.7*  MG  --   --  1.5*  GLUCOSE 74 99 122*    NUTRITION - FOCUSED PHYSICAL EXAM:   Most Recent Value  Orbital Region  No depletion  Upper Arm Region  No depletion  Thoracic and Lumbar Region  No depletion  Buccal Region  No depletion  Temple Region  No depletion  Clavicle Bone Region  No depletion  Clavicle and Acromion Bone Region  No depletion  Scapular Bone Region  No depletion  Dorsal Hand  No depletion  Patellar Region  No depletion  Anterior Thigh Region  No depletion  Posterior Calf Region  No depletion  Edema (RD Assessment)  None     Diet Order:   Diet Order            Diet NPO time specified  Diet effective now             EDUCATION NEEDS:  No education needs have been identified at this time  Skin:  Surgical incision to abdomen  Last BM:  10/18  Height:  Ht Readings from Last 1 Encounters:  03/28/18 5' 7.5" (1.715 m)   Weight:  Wt Readings from Last 1 Encounters:  03/28/18 77.2 kg   Ideal Body Weight:     BMI:  Body mass index is 26.26 kg/m.  Estimated Nutritional Needs:  Kcal:  1950-2150 kcals (25-28kcal/kg bw) Protein:  93-108g  Pro (1.2-1.4g/kg bw) Fluid:  2-2.2 L (1 ml/kcal)  Christophe Louis RD, LDN, CNSC Clinical Nutrition Available Tues-Sat via Pager: [phone] 03/28/2018 1:19 PM

## 2018-03-28 NOTE — Progress Notes (Signed)
1 Day Post-Op   Subjective/Chief Complaint: Complains of soreness   Objective: Vital signs in last 24 hours: Temp:  [97.6 F (36.4 C)-98.6 F (37 C)] 97.7 F (36.5 C) (10/19 1028) Pulse Rate:  [87-120] 87 (10/19 1028) Resp:  [14-25] 18 (10/19 0500) BP: (122-165)/(68-104) 126/78 (10/19 1028) SpO2:  [96 %-100 %] 100 % (10/19 1028) Last BM Date: 03/27/18  Intake/Output from previous day: 10/18 0701 - 10/19 0700 In: 3850 [I.V.:3650; IV Piggyback:200] Out: 380 [Urine:125; Emesis/NG output:75; Drains:130; Blood:50] Intake/Output this shift: Total I/O In: 0  Out: 585 [Urine:525; Drains:60]  General appearance: alert and cooperative Resp: clear to auscultation bilaterally Cardio: regular rate and rhythm GI: soft, appropriately tender. incision looks good. drain output serosang  Lab Results:  Recent Labs    03/27/18 1847 03/28/18 0253  WBC 13.2* 17.3*  HGB 17.3* 14.4  HCT 52.9* 43.4  PLT 261 221   BMET Recent Labs    03/27/18 1847 03/28/18 0253  NA 139 135  K 3.9 4.2  CL 101 103  CO2 25 25  GLUCOSE 99 122*  BUN 12 10  CREATININE 1.25* 1.15  CALCIUM 9.7 8.7*   PT/INR No results for input(s): LABPROT, INR in the last 72 hours. ABG No results for input(s): PHART, HCO3 in the last 72 hours.  Invalid input(s): PCO2, PO2  Studies/Results: Ct Abdomen Pelvis W Contrast  Result Date: 03/27/2018 CLINICAL DATA:  Lower abdominal pain with increased severity. Difficulty voiding. History of gastric ulcers. EXAM: CT ABDOMEN AND PELVIS WITH CONTRAST TECHNIQUE: Multidetector CT imaging of the abdomen and pelvis was performed using the standard protocol following bolus administration of intravenous contrast. CONTRAST:  OMNIPAQUE IOHEXOL 300 MG/ML  SOLN COMPARISON:  02/27/2018 CT FINDINGS: Lower chest: Normal heart size.  Clear lung bases. Hepatobiliary: Homogeneous attenuation of the liver without space-occupying mass. No biliary dilatation. The gallbladder is  physiologically distended without stones. Pancreas: Unremarkable Spleen: Normal size. Adrenals/Urinary Tract: Normal bilateral adrenal glands. Punctate calculi in the right kidney without obstructive uropathy. The urinary bladder is unremarkable for the degree of distention. Stomach/Bowel: Physiologic distention of the stomach. There is free air in the upper abdomen along the falciform ligament and subdiaphragmatic in etiology consistent with perforated hollow viscus. Small to moderate amount of free intraperitoneal fluid is noted with sympathetic dilatation of small bowel likely representing small bowel ileus. The potential for peritonitis is of concern. What appears to represent the appendix is normal. The colon is decompressed in appearance. Vascular/Lymphatic: No significant vascular findings are present. No enlarged abdominal or pelvic lymph nodes. Reproductive: Prostate is unremarkable. Other: Small to moderate amount of free fluid is described. Small amount of free air in the upper abdomen. 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 No aggressive nor acute osseous abnormality. IMPRESSION: 1. A few dots of free intraperitoneal air noted beneath the diaphragm and along the falciform ligament. Interval development of a small to moderate amount of free fluid within the abdomen and pelvis. Findings would be in keeping with perforated hollow viscus. Exact site unfortunately is not identified but given history of gastric ulcers, would consider starting there initially unless clinical exam dictates otherwise. This would also corroborated with air mostly seen in the upper abdomen though this may simply be due to patient being mostly upright prior to imaging. 2. Concern for peritonitis given presence of small to moderate free fluid within the abdomen and pelvis. 3. Probable sympathetic ileus causing dilated fluid-filled small bowel without evidence of mechanical bowel obstruction. These results were called  by  telephone at the time of interpretation on 03/27/2018 at 10:15 pm to PA [facility] , who verbally acknowledged these results. Electronically Signed   By: Tollie Eth M.D.   On: 03/27/2018 22:15    Anti-infectives: Anti-infectives (From admission, onward)   Start     Dose/Rate Route Frequency Ordered Stop   03/28/18 0600  piperacillin-tazobactam (ZOSYN) IVPB 3.375 g     3.375 g 12.5 mL/hr over 240 Minutes Intravenous Every 8 hours 03/27/18 2322     03/27/18 2327  cefOXitin (MEFOXIN) 1-4 GM-%(50ML) IVPB    Note to Pharmacy:  [name]   : cabinet override      03/27/18 2327 03/28/18 1129   03/27/18 2245  piperacillin-tazobactam (ZOSYN) IVPB 3.375 g  Status:  Discontinued     3.375 g 100 mL/hr over 30 Minutes Intravenous  Once 03/27/18 2241 03/28/18 0111      Assessment/Plan: s/p Procedure(s): EXPLORATORY LAPAROTOMY (N/A) OMENTAL PEDICAL FLAP REPAIR OF ULCER (N/A) Continue ng and bowel rest  Continue acid blockers POD 1 repair perf gastric ulcer Continue IV zosyn  LOS: 0 days    Johnny Lynn 03/28/2018

## 2018-03-29 ENCOUNTER — Other Ambulatory Visit: Payer: Self-pay

## 2018-03-29 LAB — BASIC METABOLIC PANEL
ANION GAP: 10 (ref 5–15)
BUN: 11 mg/dL (ref 6–20)
CO2: 24 mmol/L (ref 22–32)
CREATININE: 1.2 mg/dL (ref 0.61–1.24)
Calcium: 8.8 mg/dL — ABNORMAL LOW (ref 8.9–10.3)
Chloride: 103 mmol/L (ref 98–111)
Glucose, Bld: 85 mg/dL (ref 70–99)
Potassium: 3.9 mmol/L (ref 3.5–5.1)
Sodium: 137 mmol/L (ref 135–145)

## 2018-03-29 LAB — CBC
HEMATOCRIT: 40.1 % (ref 39.0–52.0)
Hemoglobin: 13.5 g/dL (ref 13.0–17.0)
MCH: 30.4 pg (ref 26.0–34.0)
MCHC: 33.7 g/dL (ref 30.0–36.0)
MCV: 90.3 fL (ref 80.0–100.0)
NRBC: 0 % (ref 0.0–0.2)
PLATELETS: 192 10*3/uL (ref 150–400)
RBC: 4.44 MIL/uL (ref 4.22–5.81)
RDW: 13.2 % (ref 11.5–15.5)
WBC: 9.2 10*3/uL (ref 4.0–10.5)

## 2018-03-29 MED ORDER — PHENOL 1.4 % MT LIQD: 1.0000 | OROMUCOSAL | Status: DC | PRN

## 2018-03-29 NOTE — Progress Notes (Signed)
Pharmacy Antibiotic Note  Johnny Lynn is a 32 y.o. male admitted on 03/27/2018 with intra-abdominal infection.  Pharmacy has been consulted for Zosyn dosing.  ID: Perforated gastric ulcer. Afebrile. WBC 17.3>9.2. CrCl 82  Plan: Zosyn 3.375g IV q 8 hrs Pharmacy will sign off. Please reconsult for further dosing assitance.   Height: 5' 7.5" (171.5 cm)(pt report) Weight: 170 lb 3.2 oz (77.2 kg)(Bed weight) IBW/kg (Calculated) : 67.25  Temp (24hrs), Avg:98 F (36.7 C), Min:97.7 F (36.5 C), Max:98.2 F (36.8 C)  Recent Labs  Lab 03/24/18 1841 03/27/18 1847 03/28/18 0253 03/29/18 0301  WBC 6.7 13.2* 17.3* 9.2  CREATININE 1.17 1.25* 1.15 1.20    Estimated Creatinine Clearance: 82.6 mL/min (by C-G formula based on SCr of 1.2 mg/dL).    Allergies  Allergen Reactions  . Reglan [Metoclopramide] Anxiety    Very anxious     Ohana Birdwell S. Merilynn Finland, PharmD, BCPS Clinical Staff Pharmacist [phone]  Misty Stanley [facility] 03/29/2018 7:51 AM

## 2018-03-29 NOTE — Progress Notes (Signed)
2 Days Post-Op   Subjective/Chief Complaint: Complains of soreness   Objective: Vital signs in last 24 hours: Temp:  [97.7 F (36.5 C)-98.2 F (36.8 C)] 98 F (36.7 C) (10/20 0421) Pulse Rate:  [87-88] 88 (10/20 0421) Resp:  [18] 18 (10/20 0421) BP: (119-128)/(60-78) 128/73 (10/20 0421) SpO2:  [96 %-100 %] 100 % (10/20 0421) Weight:  [77.2 kg] 77.2 kg (10/19 1302) Last BM Date: 03/27/18  Intake/Output from previous day: 10/19 0701 - 10/20 0700 In: 5238.2 [I.V.:2912.6; IV Piggyback:2325.6] Out: 2120 [Urine:925; Emesis/NG output:1075; Drains:120] Intake/Output this shift: No intake/output data recorded.  General appearance: alert and cooperative Resp: clear to auscultation bilaterally Cardio: regular rate and rhythm GI: soft, moderate tenderness. drain output serosang  Lab Results:  Recent Labs    03/28/18 0253 03/29/18 0301  WBC 17.3* 9.2  HGB 14.4 13.5  HCT 43.4 40.1  PLT 221 192   BMET Recent Labs    03/28/18 0253 03/29/18 0301  NA 135 137  K 4.2 3.9  CL 103 103  CO2 25 24  GLUCOSE 122* 85  BUN 10 11  CREATININE 1.15 1.20  CALCIUM 8.7* 8.8*   PT/INR No results for input(s): LABPROT, INR in the last 72 hours. ABG No results for input(s): PHART, HCO3 in the last 72 hours.  Invalid input(s): PCO2, PO2  Studies/Results: Ct Abdomen Pelvis W Contrast  Result Date: 03/27/2018 CLINICAL DATA:  Lower abdominal pain with increased severity. Difficulty voiding. History of gastric ulcers. EXAM: CT ABDOMEN AND PELVIS WITH CONTRAST TECHNIQUE: Multidetector CT imaging of the abdomen and pelvis was performed using the standard protocol following bolus administration of intravenous contrast. CONTRAST:  OMNIPAQUE IOHEXOL 300 MG/ML  SOLN COMPARISON:  02/27/2018 CT FINDINGS: Lower chest: Normal heart size.  Clear lung bases. Hepatobiliary: Homogeneous attenuation of the liver without space-occupying mass. No biliary dilatation. The gallbladder is physiologically  distended without stones. Pancreas: Unremarkable Spleen: Normal size. Adrenals/Urinary Tract: Normal bilateral adrenal glands. Punctate calculi in the right kidney without obstructive uropathy. The urinary bladder is unremarkable for the degree of distention. Stomach/Bowel: Physiologic distention of the stomach. There is free air in the upper abdomen along the falciform ligament and subdiaphragmatic in etiology consistent with perforated hollow viscus. Small to moderate amount of free intraperitoneal fluid is noted with sympathetic dilatation of small bowel likely representing small bowel ileus. The potential for peritonitis is of concern. What appears to represent the appendix is normal. The colon is decompressed in appearance. Vascular/Lymphatic: No significant vascular findings are present. No enlarged abdominal or pelvic lymph nodes. Reproductive: Prostate is unremarkable. Other: Small to moderate amount of free fluid is described. Small amount of free air in the upper abdomen. 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 No aggressive nor acute osseous abnormality. IMPRESSION: 1. A few dots of free intraperitoneal air noted beneath the diaphragm and along the falciform ligament. Interval development of a small to moderate amount of free fluid within the abdomen and pelvis. Findings would be in keeping with perforated hollow viscus. Exact site unfortunately is not identified but given history of gastric ulcers, would consider starting there initially unless clinical exam dictates otherwise. This would also corroborated with air mostly seen in the upper abdomen though this may simply be due to patient being mostly upright prior to imaging. 2. Concern for peritonitis given presence of small to moderate free fluid within the abdomen and pelvis. 3. Probable sympathetic ileus causing dilated fluid-filled small bowel without evidence of mechanical bowel obstruction. These results were called by  telephone at the  time of interpretation on 03/27/2018 at 10:15 pm to PA [facility] , who verbally acknowledged these results. Electronically Signed   By: Tollie Eth M.D.   On: 03/27/2018 22:15    Anti-infectives: Anti-infectives (From admission, onward)   Start     Dose/Rate Route Frequency Ordered Stop   03/28/18 0600  piperacillin-tazobactam (ZOSYN) IVPB 3.375 g     3.375 g 12.5 mL/hr over 240 Minutes Intravenous Every 8 hours 03/27/18 2322     03/27/18 2327  cefOXitin (MEFOXIN) 1-4 GM-%(50ML) IVPB    Note to Pharmacy:  [name]   : cabinet override      03/27/18 2327 03/28/18 1129   03/27/18 2245  piperacillin-tazobactam (ZOSYN) IVPB 3.375 g  Status:  Discontinued     3.375 g 100 mL/hr over 30 Minutes Intravenous  Once 03/27/18 2241 03/28/18 0111      Assessment/Plan: s/p Procedure(s): EXPLORATORY LAPAROTOMY (N/A) OMENTAL PEDICAL FLAP REPAIR OF ULCER (N/A) continue ng and bowel rest  POD 2 Will likely need swallow study early this week to rule out leak ambulate  LOS: 1 day    Chevis Pretty III 03/29/2018

## 2018-03-29 NOTE — Anesthesia Postprocedure Evaluation (Signed)
Anesthesia Post Note  Patient: Johnny Lynn  Procedure(s) Performed: EXPLORATORY LAPAROTOMY (N/A Abdomen) OMENTAL PEDICAL FLAP REPAIR OF ULCER (N/A Abdomen)     Patient location during evaluation: PACU Anesthesia Type: General Level of consciousness: awake and alert Pain management: pain level controlled Vital Signs Assessment: post-procedure vital signs reviewed and stable Respiratory status: spontaneous breathing, nonlabored ventilation, respiratory function stable and patient connected to nasal cannula oxygen Cardiovascular status: blood pressure returned to baseline and stable Postop Assessment: no apparent nausea or vomiting Anesthetic complications: no    Last Vitals:  Vitals:   03/28/18 2202 03/29/18 0421  BP: 121/60 128/73  Pulse: 88 88  Resp: 18 18  Temp: 36.8 C 36.7 C  SpO2: 96% 100%    Last Pain:  Vitals:   03/29/18 0506  TempSrc:   PainSc: 3                  Wilbert Schouten S

## 2018-03-30 ENCOUNTER — Inpatient Hospital Stay (HOSPITAL_COMMUNITY): Payer: Self-pay

## 2018-03-30 ENCOUNTER — Encounter (HOSPITAL_COMMUNITY): Payer: Self-pay | Admitting: Surgery

## 2018-03-30 LAB — TROPONIN I: Troponin I: <0.03 ng/mL (ref <0.03)

## 2018-03-30 MED ORDER — ENOXAPARIN SODIUM 40 MG/0.4ML ~~LOC~~ SOLN
40.0000 mg | SUBCUTANEOUS | Status: DC
  Administered 2018-03-30 – 2018-04-01 (×3): 40 mg via SUBCUTANEOUS
  Filled 2018-03-30 (×4): qty 0.4

## 2018-03-30 MED ORDER — PHENOL 1.4 % MT LIQD
1.0000 | OROMUCOSAL | Status: DC | PRN
  Filled 2018-03-30: qty 177

## 2018-03-30 NOTE — Progress Notes (Signed)
[facility]/Trauma Progress Note  3 Days Post-Op   Assessment/Plan  Prepyloric perforation - S/P ex lap, omental pedicle flap repair of perforated prepyloric ulcer, Dr. Fredricka Bonine, 10/19 - UGI tomorrow - continue NGT  FEN: NGT, NPO VTE: SCD's, heparin ID: Zosyn 10/18>> Foley: none Follow up: Dr. Fredricka Bonine  DISPO: UGI tomorrow, cont NGT    LOS: 2 days    Subjective: CC: abdominal pain  Johnny Lynn has been ambulating. No flatus or BM. Pain well controlled. No issues overnight.   Objective: Vital signs in last 24 hours: Temp:  [97.5 F (36.4 C)-98.7 F (37.1 C)] 98.7 F (37.1 C) (10/21 0610) Pulse Rate:  [72-87] 85 (10/21 0610) Resp:  [16-20] 16 (10/21 0610) BP: (121-137)/(70-79) 137/79 (10/21 0610) SpO2:  [98 %-100 %] 100 % (10/21 0610) Last BM Date: 03/27/18  Intake/Output from previous day: 10/20 0701 - 10/21 0700 In: 2311.1 [I.V.:2008.8; IV Piggyback:247.2] Out: 4155 [Urine:3350; Emesis/NG output:700; Drains:105] Intake/Output this shift: Total I/O In: -  Out: 200 [Urine:200]  PE: Gen:  Alert, NAD, pleasant, cooperative Card:  RRR, no M/G/R heard Pulm:  CTA, no W/R/R, effort normal Abd: Soft, ND, +BS, incision with staples intact and without signs of infection, drain with minimal serosanguinous drainage, TTP around incision without guarding, no peritonitis Skin: no rashes noted, warm and dry   Anti-infectives: Anti-infectives (From admission, onward)   Start     Dose/Rate Route Frequency Ordered Stop   03/28/18 0600  piperacillin-tazobactam (ZOSYN) IVPB 3.375 g     3.375 g 12.5 mL/hr over 240 Minutes Intravenous Every 8 hours 03/27/18 2322     03/27/18 2327  cefOXitin (MEFOXIN) 1-4 GM-%(50ML) IVPB    Note to Pharmacy:  [name]   : cabinet override      03/27/18 2327 03/28/18 1129   03/27/18 2245  piperacillin-tazobactam (ZOSYN) IVPB 3.375 g  Status:  Discontinued     3.375 g 100 mL/hr over 30 Minutes Intravenous  Once 03/27/18 2241 03/28/18  0111      Lab Results:  Recent Labs    03/28/18 0253 03/29/18 0301  WBC 17.3* 9.2  HGB 14.4 13.5  HCT 43.4 40.1  PLT 221 192   BMET Recent Labs    03/28/18 0253 03/29/18 0301  NA 135 137  K 4.2 3.9  CL 103 103  CO2 25 24  GLUCOSE 122* 85  BUN 10 11  CREATININE 1.15 1.20  CALCIUM 8.7* 8.8*   Johnny Lynn/INR No results for input(s): LABPROT, INR in the last 72 hours. CMP     Component Value Date/Time   NA 137 03/29/2018 0301   K 3.9 03/29/2018 0301   CL 103 03/29/2018 0301   CO2 24 03/29/2018 0301   GLUCOSE 85 03/29/2018 0301   BUN 11 03/29/2018 0301   CREATININE 1.20 03/29/2018 0301   CALCIUM 8.8 (L) 03/29/2018 0301   PROT 7.0 03/27/2018 1847   ALBUMIN 4.2 03/27/2018 1847   AST 26 03/27/2018 1847   ALT 18 03/27/2018 1847   ALKPHOS 45 03/27/2018 1847   BILITOT 1.6 (H) 03/27/2018 1847   GFRNONAA >60 03/29/2018 0301   GFRAA >60 03/29/2018 0301   Lipase     Component Value Date/Time   LIPASE 20 03/27/2018 1847    Studies/Results: No results found.    Jerre Simon , [facility] 03/30/2018, 10:37 AM  Pager: [phone] Mon-Wed, Friday 7:00am-4:30pm Thurs 7am-11:30am  Consults: [phone]

## 2018-03-30 NOTE — Consult Note (Signed)
Reason for Consult:chest pain Referring Physician: CCS  Johnny Lynn is an 32 y.o. male.  HPI: patient is 32 year old male with past medical history significant for peptic ulcer disease was admitted on 03/27/2018 because of vague abdominal pain recently moved from [place] and was noted to have perforated peptic ulcer disease requiring emergency laparotomy and omental pedicle flap repair of the ulcer. Cardiologic consultation is called as patient developed recurrent retrosternal chest pain lasting few minutes off and on without associated symptoms states chest pain increases with deep breathing or movement.EKG done showed normal sinus rhythm with LVH with early repolarization changes and nonspecific ST-T wave changes first set of troponin I is negative.patient states he has been active all his life denies any history of exertional chest pain in the past.  Past Medical History:  Diagnosis Date  . Peptic ulcer     Past Surgical History:  Procedure Laterality Date  . ABDOMINAL SURGERY    . GREATER OMENTAL FLAP CLOSURE N/A 03/27/2018   Procedure: OMENTAL PEDICAL FLAP REPAIR OF ULCER;  Surgeon: Clovis Riley, MD;  Location: [place];  Service: General;  Laterality: N/A;  . LAPAROTOMY N/A 03/27/2018   Procedure: EXPLORATORY LAPAROTOMY;  Surgeon: Clovis Riley, MD;  Location: [place];  Service: General;  Laterality: N/A;    History reviewed. No pertinent family history.  Social History:  reports that he has quit smoking. He has never used smokeless tobacco. He reports that he has current or past drug history. Drugs: Marijuana, Cocaine, and Methamphetamines. He reports that he does not drink alcohol.  Allergies:  Allergies  Allergen Reactions  . Reglan [Metoclopramide] Anxiety    Very anxious    Medications: I have reviewed the patient's current medications.  Results for orders placed or performed during the hospital encounter of 03/27/18 (from the past 48 hour(s))  CBC     Status:  None   Collection Time: 03/29/18  3:01 AM  Result Value Ref Range   WBC 9.2 4.0 - 10.5 K/uL   RBC 4.44 4.22 - 5.81 MIL/uL   Hemoglobin 13.5 13.0 - 17.0 g/dL   HCT 40.1 39.0 - 52.0 %   MCV 90.3 80.0 - 100.0 fL   MCH 30.4 26.0 - 34.0 pg   MCHC 33.7 30.0 - 36.0 g/dL   RDW 13.2 11.5 - 15.5 %   Platelets 192 150 - 400 K/uL   nRBC 0.0 0.0 - 0.2 %    Comment: Performed at [facility], [place] [address] [place], [place] [address]  Basic metabolic panel     Status: Abnormal   Collection Time: 03/29/18  3:01 AM  Result Value Ref Range   Sodium 137 135 - 145 mmol/L   Potassium 3.9 3.5 - 5.1 mmol/L   Chloride 103 98 - 111 mmol/L   CO2 24 22 - 32 mmol/L   Glucose, Bld 85 70 - 99 mg/dL   BUN 11 6 - 20 mg/dL   Creatinine, Ser 1.20 0.61 - 1.24 mg/dL   Calcium 8.8 (L) 8.9 - 10.3 mg/dL   GFR calc non Af Amer >60 >60 mL/min   GFR calc Af Amer >60 >60 mL/min    Comment: (NOTE) The eGFR has been calculated using the CKD EPI equation. This calculation has not been validated in all clinical situations. eGFR's persistently <60 mL/min signify possible Chronic Kidney Disease.    Anion gap 10 5 - 15    Comment: Performed at [facility] [address],  [place], [place] [address]  Troponin I (q 6hr x 3)     Status: None   Collection Time: 03/30/18  2:17 PM  Result Value Ref Range   Troponin I <0.03 <0.03 ng/mL    Comment: Performed at [facility] [address] [place], [place] [address]    Dg Chest Port 1 View  Result Date: 03/30/2018 CLINICAL DATA:  32 y/o  M; pain with swallowing. EXAM: PORTABLE CHEST 1 VIEW COMPARISON:  03/24/2018 chest radiograph FINDINGS: Stable heart size and mediastinal contours are within normal limits. Enteric tube tip extends below the field of view into the abdomen. Both lungs are clear. The visualized skeletal structures are unremarkable. IMPRESSION: No acute pulmonary process identified. Enteric tube tip extends below the field of view into the  abdomen. Electronically Signed   By: Kristine Garbe M.D.   On: 03/30/2018 14:52    Review of Systems  Constitutional: Negative for chills and fever.  HENT: Positive for hearing loss.   Eyes: Negative for blurred vision.  Respiratory: Negative for cough and hemoptysis.   Cardiovascular: Positive for chest pain. Negative for palpitations, orthopnea and claudication.  Gastrointestinal: Positive for abdominal pain and nausea. Negative for vomiting.  Genitourinary: Negative for dysuria.   Blood pressure 126/76, pulse 60, temperature (!) 97.5 F (36.4 C), temperature source Oral, resp. rate 17, height 5' 7.5" (1.715 m), weight 77.2 kg, SpO2 99 %. Physical Exam  Constitutional: He is oriented to person, place, and time.  HENT:  Head: Normocephalic and atraumatic.  Eyes: Pupils are equal, round, and reactive to light. Conjunctivae are normal.  Neck: Normal range of motion. Neck supple. No JVD present. No tracheal deviation present. No thyromegaly present.  Cardiovascular: Normal rate, regular rhythm and normal heart sounds.  Respiratory: Effort normal and breath sounds normal. Stridor: rule out MI. No respiratory distress. He has no wheezes. He has no rales.  GI:  Soft mild generalized tenderness surgical dressing noted dry bowel sounds absent  Musculoskeletal: He exhibits no edema, tenderness or deformity.  Neurological: He is alert and oriented to person, place, and time. No cranial nerve deficit. Coordination normal.    Assessment/Plan: Atypical chest pain rule out MI, doubt chest pain of cardiac origin Status post exploratory laparotomy and omental pedicle flap repair of perforated prepyloric ulcer postop day 2 History of peptic ulcer disease Plan Check serial enzymes and EKG in a.m. Charolette Forward 03/30/2018, 4:05 PM

## 2018-03-30 NOTE — Progress Notes (Signed)
Patient ID: Johnny Lynn, male   DOB: Sep 17, 1985, 32 y.o.   MRN: [id number] Patient does state that he has some pain with swallowing, but the chest squeezing and "knot" pain are different and NOT related to swallowing.  His EKG does show some changes in his ST and T waves.  I have spoken with Dr. Sharyn Lull and he feels these changes look somewhat similar to his EKG prior to surgery.  He asked me to call him back if he has any acute findings on his CXR or troponins.  Will continue to closely monitor.  Letha Cape 1:39 PM 03/30/2018

## 2018-03-30 NOTE — Plan of Care (Signed)
  Problem: Education: Goal: Knowledge of General Education information will improve Description Including pain rating scale, medication(s)/side effects and non-pharmacologic comfort measures Outcome: Progressing   Problem: Health Behavior/Discharge Planning: Goal: Ability to manage health-related needs will improve Outcome: Progressing   Problem: Clinical Measurements: Goal: Ability to maintain clinical measurements within normal limits will improve Outcome: Progressing Goal: Will remain free from infection Outcome: Progressing Goal: Diagnostic test results will improve Outcome: Progressing Goal: Respiratory complications will improve Outcome: Progressing Goal: Cardiovascular complication will be avoided Outcome: Progressing   Problem: Activity: Goal: Risk for activity intolerance will decrease Outcome: Progressing   Problem: Nutrition: Goal: Adequate nutrition will be maintained Outcome: Progressing   Problem: Coping: Goal: Level of anxiety will decrease Outcome: Progressing   Problem: Elimination: Goal: Will not experience complications related to urinary retention Outcome: Progressing   Problem: Pain Managment: Goal: General experience of comfort will improve Outcome: Progressing   Problem: Safety: Goal: Ability to remain free from injury will improve Outcome: Progressing   Problem: Skin Integrity: Goal: Risk for impaired skin integrity will decrease Outcome: Progressing   Problem: Clinical Measurements: Goal: Ability to maintain clinical measurements within normal limits will improve Outcome: Progressing Goal: Postoperative complications will be avoided or minimized Outcome: Progressing   Problem: Skin Integrity: Goal: Demonstration of wound healing without infection will improve Outcome: Progressing   Sonny Masters, RN 03/30/2018

## 2018-03-30 NOTE — Care Management Note (Addendum)
Case Management Note  Patient Details  Name: Johnny Lynn MRN: [id number] Date of Birth: 08-Jul-1985  Subjective/Objective:                    Action/Plan: NGT, plan upper GI tomorrow 03-31-18 . Will continue to follow for discharge needs.   Provide MATCH letter for medication assistance on day of discharge.  Hospital follow up appointment April 09, 2018 at 1:30 pm  Expected Discharge Date:  03/30/18               Expected Discharge Plan:  Home w Home Health Services  In-House Referral:     Discharge planning Services  CM Consult, Medication Assistance, MATCH Program, Indigent [facility]  Post Acute Care Choice:    Choice offered to:  Patient  DME Arranged:    DME Agency:     HH Arranged:    HH Agency:     Status of Service:  In process, will continue to follow  If discussed at Long Length of Stay Meetings, dates discussed:    Additional Comments:  Kingsley Plan, RN 03/30/2018, 11:28 AM

## 2018-03-30 NOTE — Progress Notes (Signed)
Got a call from the nurse that the pt was having chest pain. When I asked the pt about the chest pain he stated it was only with swallowing and feels like a knot in his mid chest. The pain is intermittent, associated with swallowing only, and not associated with SOB, dizziness, or other symptoms. Pt looks well, not diaphoretic, rate and effort of breathing normal, pulse rate 60 and O2 sats 99 on RA. Discussed chest xray and EKG with pt.   Mattie Marlin, [facility] Pager [phone]

## 2018-03-31 ENCOUNTER — Inpatient Hospital Stay (HOSPITAL_COMMUNITY): Payer: Self-pay

## 2018-03-31 LAB — TROPONIN I

## 2018-03-31 MED ORDER — OXYCODONE HCL 5 MG PO TABS
5.0000 mg | ORAL_TABLET | ORAL | Status: DC | PRN
  Administered 2018-04-01 – 2018-04-02 (×3): 10 mg via ORAL
  Administered 2018-04-02: 5 mg via ORAL
  Filled 2018-03-31 (×5): qty 2

## 2018-03-31 MED ORDER — METHOCARBAMOL 500 MG PO TABS
500.0000 mg | ORAL_TABLET | Freq: Four times a day (QID) | ORAL | Status: DC | PRN
  Administered 2018-04-01 – 2018-04-02 (×3): 500 mg via ORAL
  Filled 2018-03-31 (×3): qty 1

## 2018-03-31 MED ORDER — HYDROMORPHONE HCL 1 MG/ML IJ SOLN
0.5000 mg | INTRAMUSCULAR | Status: DC | PRN
  Administered 2018-03-31 – 2018-04-01 (×4): 0.5 mg via INTRAVENOUS
  Filled 2018-03-31 (×4): qty 1

## 2018-03-31 MED ORDER — IOHEXOL 300 MG/ML  SOLN
150.0000 mL | Freq: Once | INTRAMUSCULAR | Status: AC | PRN
  Administered 2018-03-31: 120 mL via ORAL

## 2018-03-31 NOTE — Progress Notes (Signed)
[facility]/Trauma Progress Note  34 Years Post-Op   Assessment/Plan  Prepyloric perforation - S/P ex lap, omental pedicle flap repair of perforated prepyloric ulcer, Dr. Fredricka Bonine, 10/19 - UGI tomorrow - continue NGT  Chest pain - troponins neg, chest xray without acute abnormalities, repeat EKG today pending - cards following and appreciate their assistance  FEN: NGT, NPO, UGI today VTE: SCD's, heparin ID: Zosyn 10/18>> Foley: none Follow up: Dr. Fredricka Bonine  DISPO: UGI today, cont NGT, PPI. Encourage ambulation    LOS: 3 days    Subjective: CC: abdominal pain  No more chest pain. No SOB or difficulty breathing. Having flatus. Abdominal pain unchanged. No pain or swelling in calves b/l. No issues overnight  Objective: Vital signs in last 24 hours: Temp:  [97.5 F (36.4 C)-98.4 F (36.9 C)] 98 F (36.7 C) (10/22 0802) Pulse Rate:  [58-61] 61 (10/22 0802) Resp:  [15-17] 15 (10/22 0802) BP: (124-137)/(66-78) 137/73 (10/22 0802) SpO2:  [94 %-99 %] 99 % (10/22 0802) Last BM Date: 03/27/18  Intake/Output from previous day: 10/21 0701 - 10/22 0700 In: 1916.2 [P.O.:60; I.V.:1706.2; IV Piggyback:150] Out: 2790 [Urine:1300; Emesis/NG output:1400; Drains:90] Intake/Output this shift: Total I/O In: 204.8 [I.V.:185.5; IV Piggyback:19.2] Out: 400 [Urine:400]  PE: Gen:  Alert, NAD, pleasant, cooperative Card:  mild bradycardia, regular rhythm, no M/G/R heard Pulm:  CTA, no W/R/R, effort normal Abd: Soft, ND, +BS, incision C/D/I, drain with minimal serosanguinous drainage, TTP around incision without guarding, no peritonitis Extremities: no swelling, increased warmth, pain in calves b/l Skin: no rashes noted, warm and dry   Anti-infectives: Anti-infectives (From admission, onward)   Start     Dose/Rate Route Frequency Ordered Stop   03/28/18 0600  piperacillin-tazobactam (ZOSYN) IVPB 3.375 g     3.375 g 12.5 mL/hr over 240 Minutes Intravenous Every 8  hours 03/27/18 2322     03/27/18 2327  cefOXitin (MEFOXIN) 1-4 GM-%(50ML) IVPB    Note to Pharmacy:  [name]   : cabinet override      03/27/18 2327 03/28/18 1129   03/27/18 2245  piperacillin-tazobactam (ZOSYN) IVPB 3.375 g  Status:  Discontinued     3.375 g 100 mL/hr over 30 Minutes Intravenous  Once 03/27/18 2241 03/28/18 0111      Lab Results:  Recent Labs    03/29/18 0301  WBC 9.2  HGB 13.5  HCT 40.1  PLT 192   BMET Recent Labs    03/29/18 0301  NA 137  K 3.9  CL 103  CO2 24  GLUCOSE 85  BUN 11  CREATININE 1.20  CALCIUM 8.8*   PT/INR No results for input(s): LABPROT, INR in the last 72 hours. CMP     Component Value Date/Time   NA 137 03/29/2018 0301   K 3.9 03/29/2018 0301   CL 103 03/29/2018 0301   CO2 24 03/29/2018 0301   GLUCOSE 85 03/29/2018 0301   BUN 11 03/29/2018 0301   CREATININE 1.20 03/29/2018 0301   CALCIUM 8.8 (L) 03/29/2018 0301   PROT 7.0 03/27/2018 1847   ALBUMIN 4.2 03/27/2018 1847   AST 26 03/27/2018 1847   ALT 18 03/27/2018 1847   ALKPHOS 45 03/27/2018 1847   BILITOT 1.6 (H) 03/27/2018 1847   GFRNONAA >60 03/29/2018 0301   GFRAA >60 03/29/2018 0301   Lipase     Component Value Date/Time   LIPASE 20 03/27/2018 1847    Studies/Results: Dg Chest Port 1 View  Result Date: 03/30/2018 CLINICAL DATA:  32 y/o  M; pain with swallowing. EXAM: PORTABLE CHEST 1 VIEW COMPARISON:  03/24/2018 chest radiograph FINDINGS: Stable heart size and mediastinal contours are within normal limits. Enteric tube tip extends below the field of view into the abdomen. Both lungs are clear. The visualized skeletal structures are unremarkable. IMPRESSION: No acute pulmonary process identified. Enteric tube tip extends below the field of view into the abdomen. Electronically Signed   By: Mitzi Hansen M.D.   On: 03/30/2018 14:52      Johnny Lynn , [facility] 03/31/2018, 10:49 AM  Pager: [phone] Mon-Wed,  Friday 7:00am-4:30pm Thurs 7am-11:30am  Consults: [phone]

## 2018-03-31 NOTE — Progress Notes (Signed)
Subjective:   patient denies any chest pain or shortness of breath.  States abdominal pain has improved. Having flatus.  Objective:  Vital Signs in the last 24 hours: Temp:  [97.5 F (36.4 C)-98.3 F (36.8 C)] 98 F (36.7 C) (10/22 0802) Pulse Rate:  [58-61] 61 (10/22 0802) Resp:  [15-17] 15 (10/22 0802) BP: (124-137)/(66-76) 137/73 (10/22 0802) SpO2:  [94 %-99 %] 99 % (10/22 0802)  Intake/Output from previous day: 10/21 0701 - 10/22 0700 In: 1916.2 [P.O.:60; I.V.:1706.2; IV Piggyback:150] Out: 2790 [Urine:1300; Emesis/NG output:1400; Drains:90] Intake/Output from this shift: Total I/O In: 204.8 [I.V.:185.5; IV Piggyback:19.2] Out: 425 [Urine:400; Drains:25]  Physical Exam: Neck: no adenopathy, no carotid bruit, no JVD and supple, symmetrical, trachea midline Lungs: clear to auscultation bilaterally Heart: regular rate and rhythm and S1, S2 normal Abdomen: soft, non-tender; bowel sounds normal; no masses,  no organomegaly Extremities: extremities normal, atraumatic, no cyanosis or edema  Lab Results: Recent Labs    03/29/18 0301  WBC 9.2  HGB 13.5  PLT 192   Recent Labs    03/29/18 0301  NA 137  K 3.9  CL 103  CO2 24  GLUCOSE 85  BUN 11  CREATININE 1.20   Recent Labs    03/30/18 2037 03/31/18 0304  TROPONINI <0.03 <0.03   Hepatic Function Panel No results for input(s): PROT, ALBUMIN, AST, ALT, ALKPHOS, BILITOT, BILIDIR, IBILI in the last 72 hours. No results for input(s): CHOL in the last 72 hours. No results for input(s): PROTIME in the last 72 hours.  Imaging: Imaging results have been reviewed and Dg Chest Port 1 View  Result Date: 03/30/2018 CLINICAL DATA:  32 y/o  M; pain with swallowing. EXAM: PORTABLE CHEST 1 VIEW COMPARISON:  03/24/2018 chest radiograph FINDINGS: Stable heart size and mediastinal contours are within normal limits. Enteric tube tip extends below the field of view into the abdomen. Both lungs are clear. The visualized skeletal  structures are unremarkable. IMPRESSION: No acute pulmonary process identified. Enteric tube tip extends below the field of view into the abdomen. Electronically Signed   By: Mitzi Hansen M.D.   On: 03/30/2018 14:52    Cardiac Studies:  Assessment/Plan:  Status postAtypical chest pain MI ruled out Status post exploratory laparotomy and omental pedicle flap repair of perforated prepyloric ulcer postop day 2 History of peptic ulcer disease GERD. Plan Continue present management per surgery.   I will sign off.  Please call if needed  LOS: 3 days    Rinaldo Cloud 03/31/2018, 12:34 PM

## 2018-03-31 NOTE — Progress Notes (Signed)
Nutrition Follow-up  DOCUMENTATION CODES:   Not applicable  INTERVENTION:   -RD will follow for diet advancement and supplement as appropriate  NUTRITION DIAGNOSIS:   Increased nutrient needs related to post-op healing as evidenced by estimated needs.  Ongoing  GOAL:   Patient will meet greater than or equal to 90% of their needs  Unmet  MONITOR:   PO intake, Supplement acceptance, Diet advancement, Labs, Weight trends  REASON FOR ASSESSMENT:   Malnutrition Screening Tool    ASSESSMENT:   32 y/o male Pmhx peptic ulcer. Presented with progressively worsening abdominal pain x3 days. Felt "popping" Friday followed by immense, unrelenting pain and nausea. Imaging showed perforated viscus s/p repair 10/19.    10/19- s/p Procedure: exploratory laparotomy, omental pedicle flap repair of perforated prepyloric ulcer  Reviewed I/O's: -874 ml x 24 hours and +3.9 L since admission  NGT: 1400 ml output x 24 hours  Pt receiving nursing care at time of visit. Plan for UGI today to rule out leak.   Pt remains NPO with NGT connected to intermittent suction.   Labs reviewed.   Diet Order:   Diet Order            Diet NPO time specified  Diet effective now              EDUCATION NEEDS:   No education needs have been identified at this time  Skin:  Skin Assessment: Skin Integrity Issues: Skin Integrity Issues:: Incisions Incisions: Surgical incision to abdomen  Last BM:  03/27/18  Height:   Ht Readings from Last 1 Encounters:  03/28/18 5' 7.5" (1.715 m)    Weight:   Wt Readings from Last 1 Encounters:  03/28/18 77.2 kg    Ideal Body Weight:  68.6 kg  BMI:  Body mass index is 26.26 kg/m.  Estimated Nutritional Needs:   Kcal:  2100-2300  Protein:  100-115 grams  Fluid:  > 2.1 L    Darriel Utter A. Mayford Knife, RD, LDN, CDE Pager: [phone] After hours Pager: [phone]

## 2018-03-31 NOTE — Progress Notes (Signed)
Discontinued NG tube patient tolerated well

## 2018-04-01 MED ORDER — ADULT MULTIVITAMIN W/MINERALS CH
1.0000 | ORAL_TABLET | Freq: Every day | ORAL | Status: DC
  Administered 2018-04-01 – 2018-04-03 (×3): 1 via ORAL
  Filled 2018-04-01 (×3): qty 1

## 2018-04-01 MED ORDER — ACETAMINOPHEN 500 MG PO TABS
500.0000 mg | ORAL_TABLET | Freq: Three times a day (TID) | ORAL | Status: DC
  Administered 2018-04-01 (×2): 500 mg via ORAL
  Filled 2018-04-01 (×2): qty 1

## 2018-04-01 MED ORDER — PANTOPRAZOLE SODIUM 40 MG PO TBEC: 40.0000 mg | DELAYED_RELEASE_TABLET | Freq: Two times a day (BID) | ORAL | Status: DC

## 2018-04-01 MED ORDER — PANTOPRAZOLE SODIUM 40 MG PO TBEC
40.0000 mg | DELAYED_RELEASE_TABLET | Freq: Two times a day (BID) | ORAL | Status: DC
  Administered 2018-04-01 – 2018-04-03 (×4): 40 mg via ORAL
  Filled 2018-04-01 (×5): qty 1

## 2018-04-01 MED ORDER — BOOST / RESOURCE BREEZE PO LIQD CUSTOM
1.0000 | Freq: Three times a day (TID) | ORAL | Status: DC
  Administered 2018-04-01 – 2018-04-03 (×5): 1 via ORAL

## 2018-04-01 NOTE — Progress Notes (Signed)
Nutrition Follow-up  DOCUMENTATION CODES:   Not applicable  INTERVENTION:   -Boost Breeze po TID, each supplement provides 250 kcal and 9 grams of protein -MVI with minerals daily -RD will follow for diet advancement and adjust supplement regimen as appropriate  NUTRITION DIAGNOSIS:   Increased nutrient needs related to post-op healing as evidenced by estimated needs.  Ongoing  GOAL:   Patient will meet greater than or equal to 90% of their needs  Progressing  MONITOR:   PO intake, Supplement acceptance, Diet advancement, Labs, Weight trends  REASON FOR ASSESSMENT:   Malnutrition Screening Tool    ASSESSMENT:   32 y/o male Pmhx peptic ulcer. Presented with progressively worsening abdominal pain x3 days. Felt "popping" Friday followed by immense, unrelenting pain and nausea. Imaging showed perforated viscus s/p repair 10/19.    10/19- s/p Procedure:exploratory laparotomy, omental pedicle flap repair of perforated prepyloric ulcer 10/22- s/p UGI- no evidence of leak, NGT removed  Reviewed I/O's: 1+15 ml x24 hours and +3.9 L since admission  Reviewed surgery notes, plan to start clears today. Diet was just advanced around 1000 today, so no meal completion data available yet. RD will add Boost Breeze supplements to optimize calorie and protein intake while on clear liquid diet.   Labs reviewed.   Diet Order:   Diet Order            Diet clear liquid Room service appropriate? Yes; Fluid consistency: Thin  Diet effective now              EDUCATION NEEDS:   No education needs have been identified at this time  Skin:  Skin Assessment: Skin Integrity Issues: Skin Integrity Issues:: Incisions Incisions: Surgical incision to abdomen  Last BM:  03/27/18  Height:   Ht Readings from Last 1 Encounters:  03/28/18 5' 7.5" (1.715 m)    Weight:   Wt Readings from Last 1 Encounters:  03/28/18 77.2 kg    Ideal Body Weight:  68.6 kg  BMI:  Body mass index is  26.26 kg/m.  Estimated Nutritional Needs:   Kcal:  2100-2300  Protein:  100-115 grams  Fluid:  > 2.1 L    Alfrieda Tarry A. Mayford Knife, RD, LDN, CDE Pager: [phone] After hours Pager: [phone]

## 2018-04-01 NOTE — Progress Notes (Signed)
[facility] Progress Note  5 Days Post-Op  Subjective: CC-  Continues to have some abdominal discomfort, but overall doing well. NG tube removed yesterday evening. Passing some flatus. No BM. Chest pain resolved after removing NG tube.  Objective: Vital signs in last 24 hours: Temp:  [97.9 F (36.6 C)-98.3 F (36.8 C)] 98 F (36.7 C) (10/23 0441) Pulse Rate:  [52-60] 52 (10/23 0441) Resp:  [18] 18 (10/23 0441) BP: (120-129)/(68-78) 120/71 (10/23 0441) SpO2:  [93 %-100 %] 97 % (10/23 0441) Last BM Date: 03/27/18  Intake/Output from previous day: 10/22 0701 - 10/23 0700 In: 1704.8 [I.V.:1685.5; IV Piggyback:19.2] Out: 1690 [Urine:1225; Emesis/NG output:350; Drains:115] Intake/Output this shift: Total I/O In: -  Out: 500 [Urine:500]  PE: Gen:  Alert, NAD, pleasant HEENT: EOM's intact, pupils equal and round Card:  RRR Pulm:  CTAB, no W/R/R, effort normal Abd: Soft, ND, mild global TTP without rebound or guarding, +BS, midline incision cdi with staples intact and no erythema or drainage, JP drainage SS Psych: A&Ox3  Skin: no rashes noted, warm and dry  Lab Results:  No results for input(s): WBC, HGB, HCT, PLT in the last 72 hours. BMET No results for input(s): NA, K, CL, CO2, GLUCOSE, BUN, CREATININE, CALCIUM in the last 72 hours. PT/INR No results for input(s): LABPROT, INR in the last 72 hours. CMP     Component Value Date/Time   NA 137 03/29/2018 0301   K 3.9 03/29/2018 0301   CL 103 03/29/2018 0301   CO2 24 03/29/2018 0301   GLUCOSE 85 03/29/2018 0301   BUN 11 03/29/2018 0301   CREATININE 1.20 03/29/2018 0301   CALCIUM 8.8 (L) 03/29/2018 0301   PROT 7.0 03/27/2018 1847   ALBUMIN 4.2 03/27/2018 1847   AST 26 03/27/2018 1847   ALT 18 03/27/2018 1847   ALKPHOS 45 03/27/2018 1847   BILITOT 1.6 (H) 03/27/2018 1847   GFRNONAA >60 03/29/2018 0301   GFRAA >60 03/29/2018 0301   Lipase     Component Value Date/Time   LIPASE 20 03/27/2018 1847        Studies/Results: Dg Chest Port 1 View  Result Date: 03/30/2018 CLINICAL DATA:  32 y/o  M; pain with swallowing. EXAM: PORTABLE CHEST 1 VIEW COMPARISON:  03/24/2018 chest radiograph FINDINGS: Stable heart size and mediastinal contours are within normal limits. Enteric tube tip extends below the field of view into the abdomen. Both lungs are clear. The visualized skeletal structures are unremarkable. IMPRESSION: No acute pulmonary process identified. Enteric tube tip extends below the field of view into the abdomen. Electronically Signed   By: Mitzi Hansen M.D.   On: 03/30/2018 14:52   Dg Kayleen Memos W/water Sol Cm  Result Date: 03/31/2018 CLINICAL DATA:  Perforated ulcer EXAM: WATER SOLUBLE UPPER GI SERIES TECHNIQUE: Single-column upper GI series was performed using water soluble contrast. CONTRAST:  OMNIPAQUE IOHEXOL 300 MG/ML  SOLN COMPARISON:  None. FLUOROSCOPY TIME:  Fluoroscopy Time:  54 seconds Radiation Exposure Index (if provided by the fluoroscopic device): 9.7 mGy Number of Acquired Spot Images: 0 FINDINGS: 120 mL Omnipaque 300 was hand injected through an existing nasogastric tube into the stomach. The stomach was opacified adequately. Examination of the stomach demonstrated normal rugal folds and areae gastricae. The gastric mucosa appeared unremarkable without evidence of ulceration, scarring, or mass lesion. Gastric motility and emptying was normal. Fluoroscopic examination of the duodenum demonstrates no ulceration or mass lesion. No extraluminal contrast to suggest perforation. Gaseous distention of small bowel and  colon most consistent with an ileus. IMPRESSION: 1. No extraluminal contrast to suggest persistent gastric perforation. Electronically Signed   By: Elige Ko   On: 03/31/2018 13:51    Anti-infectives: Anti-infectives (From admission, onward)   Start     Dose/Rate Route Frequency Ordered Stop   03/28/18 0600  piperacillin-tazobactam (ZOSYN) IVPB 3.375  g     3.375 g 12.5 mL/hr over 240 Minutes Intravenous Every 8 hours 03/27/18 2322     03/27/18 2327  cefOXitin (MEFOXIN) 1-4 GM-%(50ML) IVPB    Note to Pharmacy:  [name]   : cabinet override      03/27/18 2327 03/28/18 1129   03/27/18 2245  piperacillin-tazobactam (ZOSYN) IVPB 3.375 g  Status:  Discontinued     3.375 g 100 mL/hr over 30 Minutes Intravenous  Once 03/27/18 2241 03/28/18 0111       Assessment/Plan Prepyloric perforation S/P ex lap, omental pedicle flap repair of perforated prepyloric ulcer, Dr. Fredricka Bonine, 10/19 - POD 4 - JP drain output SS - UGI 10/22 negative for leak, NG tube removed 10/22 - H pylori pending  Chest pain - troponins neg, chest xray without acute abnormalities - cardiology signed off - chest pain now resolved  FEN:IVF @ 179mL/hr, CLD VTE: SCD's, lovenox ZO:XWRUE 10/18>>day#6 Foley:none Follow up:Dr. Fredricka Bonine  DISPO:Advance to clear liquids. Add oral pain medications and start to wean IV dilaudid; schedule tylenol. Continue PPI. Encourage ambulation.   LOS: 4 days    Franne Forts , [facility] 04/01/2018, 9:59 AM Pager: [phone] Mon 7:00 am -11:30 AM Tues-Fri 7:00 am-4:30 pm Sat-Sun 7:00 am-11:30 am

## 2018-04-01 NOTE — Plan of Care (Signed)
  Problem: Education: Goal: Knowledge of General Education information will improve Description Including pain rating scale, medication(s)/side effects and non-pharmacologic comfort measures Outcome: Progressing Note:  POC reviewed with pt. and pain management.   Problem: Pain Managment: Goal: General experience of comfort will improve Outcome: Progressing

## 2018-04-02 LAB — H PYLORI, IGM, IGG, IGA AB
H Pylori IgG: 5.3 Index Value — ABNORMAL HIGH (ref 0.00–0.79)
H. Pylogi, Iga Abs: <9 units (ref 0.0–8.9)
H. Pylogi, Igm Abs: <9 units (ref 0.0–8.9)

## 2018-04-02 MED ORDER — TRAMADOL HCL 50 MG PO TABS
50.0000 mg | ORAL_TABLET | Freq: Four times a day (QID) | ORAL | Status: DC | PRN
  Administered 2018-04-02 – 2018-04-03 (×3): 100 mg via ORAL
  Administered 2018-04-03: 50 mg via ORAL
  Filled 2018-04-02 (×4): qty 2

## 2018-04-02 MED ORDER — ACETAMINOPHEN 325 MG PO TABS
650.0000 mg | ORAL_TABLET | Freq: Three times a day (TID) | ORAL | Status: DC
  Administered 2018-04-02 – 2018-04-03 (×4): 650 mg via ORAL
  Filled 2018-04-02 (×4): qty 2

## 2018-04-02 MED ORDER — DOCUSATE SODIUM 100 MG PO CAPS
100.0000 mg | ORAL_CAPSULE | Freq: Two times a day (BID) | ORAL | Status: DC
  Administered 2018-04-02 – 2018-04-03 (×3): 100 mg via ORAL
  Filled 2018-04-02 (×3): qty 1

## 2018-04-02 MED ORDER — METHOCARBAMOL 750 MG PO TABS
750.0000 mg | ORAL_TABLET | Freq: Three times a day (TID) | ORAL | Status: DC
  Administered 2018-04-02 – 2018-04-03 (×3): 750 mg via ORAL
  Filled 2018-04-02 (×3): qty 1

## 2018-04-02 NOTE — Clinical Social Work Note (Signed)
Clinical Social Work Assessment  Patient Details  Name: Johnny Lynn MRN: [id number] Date of Birth: 15-May-1986  Date of referral:  04/02/18               Reason for consult:  Housing Concerns/Homelessness, Discharge Planning                Permission sought to share information with:  Family Supports Permission granted to share information::  Yes, Verbal Permission Granted  Name::     Johnny Lynn::     Relationship::  brother  Contact Information:  [phone]  Housing/Transportation Living arrangements for the past 2 months:  [place] of Information:  Patient Patient Interpreter Needed:  None Criminal Activity/Legal Involvement Pertinent to Current Situation/Hospitalization:  No - Comment as needed Significant Relationships:  Other Family Members, Siblings Lives with:  Self Do you feel safe going back to the place where you live?  Yes Need for family participation in patient care:  Yes (Comment)  Care giving concerns:  CSW received consult that pt is homeless per his mother. Pt recently moved to area has been staying at his cousins house, he is able to return there at discharge. Is interested in resources for finding more permanent housing and resources in the community.    Social Worker assessment / plan:  CSW met with pt at bedside, introduced self, role, and reason for visit which is due to pt's mother calling and telling bedside RN that pt is homeless. CSW inquired as to pt's current living situation and his supports in the community. Pt states that he recently moved to [place] because he has a child being born soon. Pt states that he stays with a cousin and that she has three children in the home. Pt says that his cousin is busy with her children and her own things and he would like to see more permanent and stable housing on his own. When CSW inquired as to if pt could stay with his brother or mother he states that when he leaves he will return back to  his cousins.   Pt works for a [employer] and says that he is disappointed that he will be out of work for [employer] as he recovers. Pt understands that attending care team will give him a note regarding inability to work given restrictions and that he will return when cleared. Pt expressed interest in information about affordable housing and how to find it, the [facility], and an application for [employer]"). Pt grateful for support but did not express any additional requests of CSW to assist him before discharge.  Employment status:   Employed with a [employer] risk survey[employer] information:  Self Pay- Medicaid Potential PT Recommendations:  Not assessed at this time Information / Referral to community resources:  [place], Shelter  Patient/Family's Response to care:  Pt amenable to speaking with CSW, interested in resources but did not express any additional requests except resources offered by CSW. Will go to his cousins home at discharge.   Patient/Family's Understanding of and Emotional Response to Diagnosis, Current Treatment, and Prognosis:  Pt states understanding of diagnosis, current treatment and prognosis. Pt calm and emotionally appropriate throughout assessment and interested in any supports that CSW and care team can provide him as he discharges. He is aware discharge likely tomorrow.   Emotional Assessment Appearance:  Appears stated age Attitude/Demeanor/Rapport:  Engaged, Gracious Affect (typically observed):  Accepting, Appropriate, Pleasant, Quiet Orientation:  Oriented to Self, Oriented to Place, Oriented to  Time, Oriented to Situation Alcohol / Substance use:  Illicit Drugs Psych involvement (Current and /or in the community):  No (Comment)  Discharge Needs  Concerns to be addressed:  Discharge Planning Concerns, Homelessness, Financial / Insurance Concerns Readmission within the last 30 days:    Current discharge risk:  Inadequate Financial  Supports, Homeless Barriers to Discharge:  Continued Medical Work up   [employer], [place] 04/02/2018, 12:30 PM

## 2018-04-02 NOTE — Progress Notes (Addendum)
[facility] Progress Note  6 Days Post-Op  Subjective: CC:  abd pain improving. Reports 0/10 pain at rest, pain increases with activity. Tolerating clears - small amounts at a time due to early satiety. Denies fever, chills, nausea, vomiting. Reports some hot flashes/itching, suspect 2/2 PO oxy. Urinating and having flatus, denies BM.  Objective: Vital signs in last 24 hours: Temp:  [97.7 F (36.5 C)-98.1 F (36.7 C)] 97.7 F (36.5 C) (10/24 0516) Pulse Rate:  [59-68] 63 (10/24 0516) Resp:  [18-20] 19 (10/24 0516) BP: (119-128)/(62-79) 119/77 (10/24 0516) SpO2:  [98 %-99 %] 99 % (10/24 0516) Last BM Date: 03/27/18  Intake/Output from previous day: 10/23 0701 - 10/24 0700 In: 240 [P.O.:240] Out: 3230 [Urine:3000; Drains:230] Intake/Output this shift: Total I/O In: -  Out: 520 [Urine:520]  PE: Gen:  Alert, NAD, pleasant Card:  Regular rate and rhythm Pulm:  Normal effor Abd: Soft, approp tender, +BS, staples clean and dry. Skin: warm and dry, no rashes  Psych: A&Ox3   Lab Results:  CMP     Component Value Date/Time   NA 137 03/29/2018 0301   K 3.9 03/29/2018 0301   CL 103 03/29/2018 0301   CO2 24 03/29/2018 0301   GLUCOSE 85 03/29/2018 0301   BUN 11 03/29/2018 0301   CREATININE 1.20 03/29/2018 0301   CALCIUM 8.8 (L) 03/29/2018 0301   PROT 7.0 03/27/2018 1847   ALBUMIN 4.2 03/27/2018 1847   AST 26 03/27/2018 1847   ALT 18 03/27/2018 1847   ALKPHOS 45 03/27/2018 1847   BILITOT 1.6 (H) 03/27/2018 1847   GFRNONAA >60 03/29/2018 0301   GFRAA >60 03/29/2018 0301   Lipase     Component Value Date/Time   LIPASE 20 03/27/2018 1847       Studies/Results: Dg Kayleen Memos W/water Sol Cm  Result Date: 03/31/2018 CLINICAL DATA:  Perforated ulcer EXAM: WATER SOLUBLE UPPER GI SERIES TECHNIQUE: Single-column upper GI series was performed using water soluble contrast. CONTRAST:  OMNIPAQUE IOHEXOL 300 MG/ML  SOLN COMPARISON:  None. FLUOROSCOPY TIME:   Fluoroscopy Time:  54 seconds Radiation Exposure Index (if provided by the fluoroscopic device): 9.7 mGy Number of Acquired Spot Images: 0 FINDINGS: 120 mL Omnipaque 300 was hand injected through an existing nasogastric tube into the stomach. The stomach was opacified adequately. Examination of the stomach demonstrated normal rugal folds and areae gastricae. The gastric mucosa appeared unremarkable without evidence of ulceration, scarring, or mass lesion. Gastric motility and emptying was normal. Fluoroscopic examination of the duodenum demonstrates no ulceration or mass lesion. No extraluminal contrast to suggest perforation. Gaseous distention of small bowel and colon most consistent with an ileus. IMPRESSION: 1. No extraluminal contrast to suggest persistent gastric perforation. Electronically Signed   By: Elige Ko   On: 03/31/2018 13:51    Anti-infectives: Anti-infectives (From admission, onward)   Start     Dose/Rate Route Frequency Ordered Stop   03/28/18 0600  piperacillin-tazobactam (ZOSYN) IVPB 3.375 g     3.375 g 12.5 mL/hr over 240 Minutes Intravenous Every 8 hours 03/27/18 2322     03/27/18 2327  cefOXitin (MEFOXIN) 1-4 GM-%(50ML) IVPB    Note to Pharmacy:  [name]   : cabinet override      03/27/18 2327 03/28/18 1129   03/27/18 2245  piperacillin-tazobactam (ZOSYN) IVPB 3.375 g  Status:  Discontinued     3.375 g 100 mL/hr over 30 Minutes Intravenous  Once 03/27/18 2241 03/28/18 0111     Assessment/Plan Prepyloric perforation  S/P ex lap, omental pedicle flap repair of perforated prepyloric ulcer, Dr. Fredricka Bonine, 10/19 - POD 5 - JP drain output SS, 230 cc/24h from 115 - UGI 10/22 negative for leak, NG tube removed 10/22 - H pylori pending   Chest pain - troponins neg, chest xray without acute abnormalities - cardiology signed off - chest pain now resolved  ZOX:WRUEAV lock IV, adv diet to full liquids, then soft as tolerated; schedule robaxin, replace PRN oxy with  tramadol  VTE: SCD's, lovenox WU:JWJXB 10/18>>day#7; D/C abx after today.  Foley:none Follow up:Dr. Fredricka Bonine  DISPO:adv diet, adjust PO pain meds. Anticipate possible discharge tomorrow. Will need a work note, likely out of work 8 weeks as he does a lot of heavy lifting. Consult to social work for [employer].   LOS: 5 days    Hosie Spangle, [facility] Pager: [phone]

## 2018-04-02 NOTE — Care Management Note (Signed)
Case Management Note  Patient Details  Name: Johnny Lynn MRN: [id number] Date of Birth: 1985/07/18  Subjective/Objective:                    Action/Plan:  Provided and explained MATCH letter. Over ride entered for Ultram 50 mg . Patient voiced understanding, has appointment at [facility].   See social work note.  Expected Discharge Date:  03/30/18               Expected Discharge Plan:  Home/Self Care  In-House Referral:  Financial Counselor  Discharge planning Services  CM Consult, Medication Assistance, MATCH Program, Indigent Health Clinic  Post Acute Care Choice:    Choice offered to:  Patient  DME Arranged:  N/A DME Agency:  NA  HH Arranged:    HH Agency:     Status of Service:  In process, will continue to follow  If discussed at Long Length of Stay Meetings, dates discussed:    Additional Comments:  Kingsley Plan, RN 04/02/2018, 3:36 PM

## 2018-04-03 MED ORDER — PANTOPRAZOLE SODIUM 40 MG PO TBEC: 40.0000 mg | DELAYED_RELEASE_TABLET | Freq: Two times a day (BID) | ORAL | 1 refills | Status: DC

## 2018-04-03 MED ORDER — TRAMADOL HCL 50 MG PO TABS: 50.0000 mg | ORAL_TABLET | Freq: Three times a day (TID) | ORAL | 0 refills | Status: AC | PRN

## 2018-04-03 MED ORDER — ADULT MULTIVITAMIN W/MINERALS CH: 1.0000 | ORAL_TABLET | Freq: Every day | ORAL | Status: AC

## 2018-04-03 MED ORDER — ACETAMINOPHEN 325 MG PO TABS: 1000.0000 mg | ORAL_TABLET | Freq: Three times a day (TID) | ORAL | Status: AC | PRN

## 2018-04-03 MED ORDER — PANTOPRAZOLE SODIUM 40 MG PO TBEC: 40.0000 mg | DELAYED_RELEASE_TABLET | Freq: Every day | ORAL | 1 refills | Status: AC

## 2018-04-03 NOTE — Social Work (Signed)
CSW provided pt with resources for [place], housing support, [place], and [facility]. Pt thankful for all resources, will discharge to his cousins home where he has been staying, waiting on pick up for discharge.  CSW signing off. Please consult if any additional needs arise.  Doy Hutching, LCSWA [facility] Clinical Social Work [phone]

## 2018-04-03 NOTE — Discharge Summary (Signed)
[facility] Discharge Summary   Patient ID: Johnny Lynn MRN: [id number] DOB/AGE: [birth year] 32 y.o.  Admit date: 03/27/2018 Discharge date: 04/03/2018   Discharge Diagnosis Patient Active Problem List   Diagnosis Date Noted  . Perforated viscus 03/28/2018   Procedures Dr. Phylliss Blakes (03/28/18) - exploratory laparotomy, omental pedicle flap repair of perforated prepyloric ulcer Hospital Course:  Very nice 32yo man w/ PMH bleeding ulcer 2017 (treated endoscopically in [place]) who presented to the ED with severe abdominal pain associated with nausea. Workup significant for leukocytosis, mild AKI, UDA positive for amphetamines/cocaine/THC, and CT abd/pelvis significant for a perforated viscous with free air under the diaphragm. He was taken emergently for the above procedure prior to transfer to the floor with an NG tube in place to [facility].  He was continued on IV antibiotics and protonix post-operatively. UGI 10/22 negative for post-operative leak. Patients diet was advanced as tolerated. On 04/03/18 patients vitals were stable, leukocytosis resolved, tolerating SOFT diet, mobilizing, having bowel function, and medically stable for discharge home. Social work was consulted due to positive UDS and for housing resources given patients recent move to [place], [place]. A work note was provided to the patient. Post-op instructions discussed and the patient voiced understanding. JP drain removed day of discharge. He was discharged on daily Protonix.  Physical Exam: General:  Alert, NAD, pleasant, comfortable Abd:  Soft, NT/ND, staples clean and dry, +BS, JP drain with small amt serous drainage.  Allergies as of 04/03/2018      Reactions   Reglan [metoclopramide] Anxiety   Very anxious      Medication List    TAKE these medications   acetaminophen 325 MG tablet Commonly known as:  TYLENOL Take 3 tablets (975 mg total) by mouth every 8 (eight) hours as needed.   multivitamin  with minerals Tabs tablet Take 1 tablet by mouth daily.   pantoprazole 40 MG tablet Commonly known as:  PROTONIX Take 1 tablet (40 mg total) by mouth daily.   traMADol 50 MG tablet Commonly known as:  ULTRAM Take 1-2 tablets (50-100 mg total) by mouth every 8 (eight) hours as needed for moderate pain or severe pain.        Follow-up Information    [facility] Follow up.   Why:  Hospital follow up appointment April 09, 2018 at 1:30 pm Contact information: [address] [place] [address] [phone]       Berna Bue, MD. Go on 04/29/2018.   Specialty:  General Surgery Why:  Your appointment is 11/20 at 2:20 pm  Please arrive 15 minutes early to check in. Contact information: [address] [place] [address] [phone]        [facility], [place]. Go on 04/08/2018.   Specialty:  General Surgery Why:  Your appointment is 10/30 at 10 am with one of our nurses to have your staples removed. Please arrive 30 minutes early to check in and fill out paperwork. Bring photo ID and insurance information. Contact information: [address] [place] [address] [phone]          Signed: Hosie Spangle, [facility] 04/03/2018, 10:59 AM

## 2018-04-08 NOTE — Progress Notes (Deleted)
Patient ID: Johnny Lynn, male   DOB: 11/03/1985, 32 y.o.   MRN: [id number]  After being hospitalized 10/18-10/25/2019 for abdominal pain with ruptured stomach ulcer.     From discharge summary:  Procedures Dr. Phylliss Blakes (03/28/18) - exploratory laparotomy, omental pedicle flap repair of perforated prepyloric ulcer Hospital Course:  Very nice 32yo man w/ PMH bleeding ulcer 2017 (treated endoscopically in [place]) who presented to the ED with severe abdominal pain associated with nausea. Workup significant for leukocytosis, mild AKI, UDA positive for amphetamines/cocaine/THC, and CT abd/pelvis significant for a perforated viscous with free air under the diaphragm. He was taken emergently for the above procedure prior to transfer to the floor with an NG tube in place to [facility].  He was continued on IV antibiotics and protonix post-operatively. UGI 10/22 negative for post-operative leak. Patients diet was advanced as tolerated. On 04/03/18 patients vitals were stable, leukocytosis resolved, tolerating SOFT diet, mobilizing, having bowel function, and medically stable for discharge home. Social work was consulted due to positive UDS and for housing resources given patients recent move to [place], [place]. A work note was provided to the patient. Post-op instructions discussed and the patient voiced understanding. JP drain removed day of discharge. He was discharged on daily Protonix.

## 2018-04-09 ENCOUNTER — Inpatient Hospital Stay: Payer: Self-pay

## 2018-05-24 ENCOUNTER — Emergency Department (HOSPITAL_COMMUNITY): Payer: Self-pay

## 2018-05-24 ENCOUNTER — Emergency Department (HOSPITAL_COMMUNITY)
Admission: EM | Admit: 2018-05-24 | Discharge: 2018-05-24 | Disposition: A | Discharge status: Home / Self Care | Payer: Self-pay | Attending: Emergency Medicine | Admitting: Emergency Medicine

## 2018-05-24 ENCOUNTER — Encounter (HOSPITAL_COMMUNITY): Payer: Self-pay

## 2018-05-24 DIAGNOSIS — Z202 Contact with and (suspected) exposure to infections with a predominantly sexual mode of transmission: Secondary | ICD-10-CM | POA: Insufficient documentation

## 2018-05-24 DIAGNOSIS — Z79899 Other long term (current) drug therapy: Secondary | ICD-10-CM | POA: Insufficient documentation

## 2018-05-24 DIAGNOSIS — Z87891 Personal history of nicotine dependence: Secondary | ICD-10-CM | POA: Insufficient documentation

## 2018-05-24 DIAGNOSIS — R300 Dysuria: Secondary | ICD-10-CM | POA: Insufficient documentation

## 2018-05-24 LAB — URINALYSIS, ROUTINE W REFLEX MICROSCOPIC
Bacteria, UA: NONE SEEN
Bilirubin Urine: NEGATIVE
Glucose, UA: NEGATIVE mg/dL
Ketones, ur: NEGATIVE mg/dL
Leukocytes, UA: NEGATIVE
Nitrite: NEGATIVE
Protein, ur: NEGATIVE mg/dL
Specific Gravity, Urine: 1.02 (ref 1.005–1.030)
pH: 6 (ref 5.0–8.0)

## 2018-05-24 LAB — COMPREHENSIVE METABOLIC PANEL
ALT: 28 U/L (ref 0–44)
AST: 26 U/L (ref 15–41)
Albumin: 4 g/dL (ref 3.5–5.0)
Alkaline Phosphatase: 52 U/L (ref 38–126)
Anion gap: 10 (ref 5–15)
BUN: 11 mg/dL (ref 6–20)
CO2: 26 mmol/L (ref 22–32)
Calcium: 9.5 mg/dL (ref 8.9–10.3)
Chloride: 106 mmol/L (ref 98–111)
Creatinine, Ser: 0.92 mg/dL (ref 0.61–1.24)
GFR calc Af Amer: >60 mL/min (ref >60)
GFR calc non Af Amer: >60 mL/min (ref >60)
Glucose, Bld: 85 mg/dL (ref 70–99)
Potassium: 3.9 mmol/L (ref 3.5–5.1)
Sodium: 142 mmol/L (ref 135–145)
Total Bilirubin: 0.5 mg/dL (ref 0.3–1.2)
Total Protein: 7.6 g/dL (ref 6.5–8.1)

## 2018-05-24 LAB — CBC
HEMATOCRIT: 46 % (ref 39.0–52.0)
Hemoglobin: 14.9 g/dL (ref 13.0–17.0)
MCH: 31 pg (ref 26.0–34.0)
MCHC: 32.4 g/dL (ref 30.0–36.0)
MCV: 95.6 fL (ref 80.0–100.0)
Platelets: 181 10*3/uL (ref 150–400)
RBC: 4.81 MIL/uL (ref 4.22–5.81)
RDW: 13.2 % (ref 11.5–15.5)
WBC: 4.5 10*3/uL (ref 4.0–10.5)
nRBC: 0 % (ref 0.0–0.2)

## 2018-05-24 LAB — LIPASE, BLOOD: Lipase: 21 U/L (ref 11–51)

## 2018-05-24 MED ORDER — IOPAMIDOL (ISOVUE-300) INJECTION 61%
INTRAVENOUS | Status: AC
  Filled 2018-05-24: qty 100

## 2018-05-24 MED ORDER — SODIUM CHLORIDE 0.9 % IV BOLUS
1000.0000 mL | Freq: Once | INTRAVENOUS | Status: AC
  Administered 2018-05-24: 1000 mL via INTRAVENOUS

## 2018-05-24 MED ORDER — ONDANSETRON HCL 4 MG/2ML IJ SOLN
4.0000 mg | Freq: Once | INTRAMUSCULAR | Status: AC
  Administered 2018-05-24: 4 mg via INTRAVENOUS
  Filled 2018-05-24: qty 2

## 2018-05-24 MED ORDER — SODIUM CHLORIDE (PF) 0.9 % IJ SOLN
INTRAMUSCULAR | Status: AC
  Filled 2018-05-24: qty 50

## 2018-05-24 MED ORDER — IOPAMIDOL (ISOVUE-300) INJECTION 61%
100.0000 mL | Freq: Once | INTRAVENOUS | Status: AC | PRN
  Administered 2018-05-24: 100 mL via INTRAVENOUS

## 2018-05-24 MED ORDER — MORPHINE SULFATE (PF) 4 MG/ML IV SOLN
4.0000 mg | Freq: Once | INTRAVENOUS | Status: AC
  Administered 2018-05-24: 4 mg via INTRAVENOUS
  Filled 2018-05-24: qty 1

## 2018-05-24 MED ORDER — AZITHROMYCIN 1 G PO PACK
1.0000 g | PACK | Freq: Once | ORAL | Status: AC
  Administered 2018-05-24: 1 g via ORAL
  Filled 2018-05-24: qty 1

## 2018-05-24 MED ORDER — CEFTRIAXONE SODIUM 250 MG IJ SOLR
250.0000 mg | Freq: Once | INTRAMUSCULAR | Status: AC
  Administered 2018-05-24: 250 mg via INTRAMUSCULAR
  Filled 2018-05-24: qty 250

## 2018-05-24 MED ORDER — STERILE WATER FOR INJECTION IJ SOLN
INTRAMUSCULAR | Status: AC
  Administered 2018-05-24: 10 mL
  Filled 2018-05-24: qty 10

## 2018-05-24 NOTE — Discharge Instructions (Signed)
You were treated for gonorrhea and chlamydia. You will be called if you have positive results   Use condoms, practice safe sex   See your doctor  Return to ER if you have worse abdominal pain, vomiting, fever, penile discharge

## 2018-05-24 NOTE — ED Provider Notes (Signed)
[facility]-EMERGENCY DEPT Provider Note   CSN: [id number] Arrival date & time: 05/24/18  1035     History   Chief Complaint Chief Complaint  Patient presents with  . Abdominal Pain    HPI Johnny Lynn is a 32 y.o. male history of peptic ulcer disease with perforated ulcer status post laparotomy 2 months ago here presenting with abdominal pain.  Patient states that he has lower abdominal pain and some dysuria for the last several days.  He denies any epigastric pain or nausea or vomiting.  Patient states that this is different than his recent visit of perforated ulcer.  Patient did mention that about a week ago, he did travel to [place] and did have unprotected sex there.  He is concerned that he may have an STD.  Patient states that he has no history of STDs previously. Denies any fevers, denies testicular pain.   The history is provided by the patient.    Past Medical History:  Diagnosis Date  . Peptic ulcer     Patient Active Problem List   Diagnosis Date Noted  . Perforated viscus 03/28/2018    Past Surgical History:  Procedure Laterality Date  . ABDOMINAL SURGERY    . GREATER OMENTAL FLAP CLOSURE N/A 03/27/2018   Procedure: OMENTAL PEDICAL FLAP REPAIR OF ULCER;  Surgeon: Berna Bueonnor, Chelsea A, MD;  Location: [facility] OR;  Service: General;  Laterality: N/A;  . LAPAROTOMY N/A 03/27/2018   Procedure: EXPLORATORY LAPAROTOMY;  Surgeon: Berna Bueonnor, Chelsea A, MD;  Location: [facility] OR;  Service: General;  Laterality: N/A;        Home Medications    Prior to Admission medications   Medication Sig Start Date End Date Taking? Authorizing Provider  omeprazole (PRILOSEC OTC) 20 MG tablet Take 20 mg by mouth daily as needed (acid reflux).   Yes [provider]  pantoprazole (PROTONIX) 40 MG tablet Take 1 tablet (40 mg total) by mouth daily. 04/03/18  Yes Simaan, Francine GravenElizabeth S, PA-C  acetaminophen (TYLENOL) 325 MG tablet Take 3 tablets (975 mg total) by mouth  every 8 (eight) hours as needed. Patient not taking: Reported on 05/24/2018 04/03/18   Adam PhenixSimaan, Elizabeth S, PA-C  Multiple Vitamin (MULTIVITAMIN WITH MINERALS) TABS tablet Take 1 tablet by mouth daily. Patient not taking: Reported on 05/24/2018 04/03/18   Adam PhenixSimaan, Elizabeth S, PA-C  traMADol (ULTRAM) 50 MG tablet Take 1-2 tablets (50-100 mg total) by mouth every 8 (eight) hours as needed for moderate pain or severe pain. Patient not taking: Reported on 05/24/2018 04/03/18   Adam PhenixSimaan, Elizabeth S, PA-C    Family History History reviewed. No pertinent family history.  Social History Social History   Tobacco Use  . Smoking status: Former Games developermoker  . Smokeless tobacco: Never Used  Substance Use Topics  . Alcohol use: Never    Frequency: Never  . Drug use: Yes    Types: Marijuana, Cocaine, Methamphetamines     Allergies   Reglan [metoclopramide]   Review of Systems Review of Systems  Gastrointestinal: Positive for abdominal pain.  Genitourinary: Positive for dysuria.  All other systems reviewed and are negative.    Physical Exam Updated Vital Signs BP (!) 141/73 (BP Location: Left Arm)   Pulse 84   Temp (!) 97.5 F (36.4 C) (Oral)   Resp 18   SpO2 97%   Physical Exam Vitals signs and nursing note reviewed.  Constitutional:      Appearance: He is well-developed.  HENT:     Head:  Normocephalic.     Mouth/Throat:     Mouth: Mucous membranes are moist.  Eyes:     Extraocular Movements: Extraocular movements intact.  Cardiovascular:     Rate and Rhythm: Normal rate and regular rhythm.  Pulmonary:     Effort: Pulmonary effort is normal.     Breath sounds: Normal breath sounds.  Abdominal:     General: Abdomen is flat. Bowel sounds are normal.     Comments: + LLQ tenderness, complicated midline surgical scar healing well   Genitourinary:    Comments: No obvious penile discharge. No testicular tenderness  Skin:    General: Skin is warm.     Capillary Refill:  Capillary refill takes less than 2 seconds.  Neurological:     General: No focal deficit present.     Mental Status: He is alert.  Psychiatric:        Mood and Affect: Mood normal.      ED Treatments / Results  Labs (all labs ordered are listed, but only abnormal results are displayed) Labs Reviewed  LIPASE, BLOOD  COMPREHENSIVE METABOLIC PANEL  CBC  URINALYSIS, ROUTINE W REFLEX MICROSCOPIC  GC/CHLAMYDIA PROBE AMP ([place]) NOT AT [facility]    EKG None  Radiology No results found.  Procedures Procedures (including critical care time)  Medications Ordered in ED Medications  cefTRIAXone (ROCEPHIN) injection 250 mg (has no administration in time range)  azithromycin (ZITHROMAX) powder 1 g (has no administration in time range)  iopamidol (ISOVUE-300) 61 % injection 100 mL (has no administration in time range)  sodium chloride 0.9 % bolus 1,000 mL (1,000 mLs Intravenous New Bag/Given 05/24/18 1319)  morphine 4 MG/ML injection 4 mg (4 mg Intravenous Given 05/24/18 1320)  ondansetron (ZOFRAN) injection 4 mg (4 mg Intravenous Given 05/24/18 1320)     Initial Impression / Assessment and Plan / ED Course  I have reviewed the triage vital signs and the nursing notes.  Pertinent labs & imaging results that were available during my care of the patient were reviewed by me and considered in my medical decision making (see chart for details).    Johnny Lynn is a 31 y.o. male here with dysuria, LLQ pain. Had recent perforated peptic ulcer and had laparotomy but has no epigastric pain. Did have possible STD exposure and he is concerned about it. Will get labs, UA, GC/chlamydia. He request empiric treatment for STDs.   2:54 PM Labs and UA and CT ab/pel unremarkable. Empirically treated for STDs. Stable for discharge. Told him to use condoms next time and he will be called for positive results.    Final Clinical Impressions(s) / ED Diagnoses   Final diagnoses:  None    ED  Discharge Orders    None       Charlynne Pander, MD 05/24/18 1455

## 2018-05-24 NOTE — ED Triage Notes (Signed)
Patient c/o since generalized lower abdominal pain that started Friday (8/10 cramping pain) Tried to urinate Friday and and felt some discomfort. Denies burning. Patient c/o of chills and sweating. Patient c/o of sore throat and productive cough. Patient is a current smoker. Patient reports some nausea and denies vomiting.   A/O x4 Ambulatory in triage.

## 2018-05-25 LAB — GC/CHLAMYDIA PROBE AMP (~~LOC~~) NOT AT ARMC
Chlamydia: NEGATIVE
Neisseria Gonorrhea: NEGATIVE

## 2018-06-08 NOTE — ED Notes (Signed)
06/08/2018,  Pt. Called and requested his STD results.  Results reviewed with pt.  All negative. All questions answered

## 2018-10-07 NOTE — ED Notes (Signed)
ED Patient Education Note     Patient Education Materials Follows:  Home Health Care     Epilepsy    Epilepsy is a disorder in which a person has repeated seizures over time. A seizure is a release of abnormal electrical activity in the brain. Seizures can cause a change in attention, behavior, or the ability to remain awake and alert (altered mental status). Seizures often involve uncontrollable shaking (convulsions).     Most people with epilepsy lead normal lives. However, people with epilepsy are at an increased risk of falls, accidents, and injuries. Therefore, it is important to begin treatment right away.    CAUSES    Epilepsy has many possible causes. Anything that disturbs the normal pattern of brain cell activity can lead to seizures. This may include:      Head injury.      Birth trauma.     High fever as a child.     Stroke.     Bleeding into or around the brain.     Certain drugs.     Prolonged low oxygen, such as what occurs after CPR efforts.      Abnormal brain development.      Certain illnesses, such as meningitis, encephalitis (brain infection), malaria, and other infections.      An imbalance of nerve signaling chemicals (neurotransmitters). ?    SIGNS AND SYMPTOMS    The symptoms of a seizure can vary greatly from one person to another. Right before a seizure, you may have a warning (aura) that a seizure is about to occur. An aura may include the following symptoms:     Fear or anxiety.     Nausea.     Feeling like the room is spinning (vertigo).     Vision changes, such as seeing flashing lights or spots.    Common symptoms during a seizure include:     Abnormal sensations, such as an abnormal smell or a bitter taste in the mouth. ?     Sudden, general body stiffness. ?     Convulsions that involve rhythmic jerking of the face, arm, or leg on one or both sides. ?     Sudden change in consciousness. ?    ? Appearing to be awake but not responding. ?    ? Appearing to be asleep but cannot be  awakened. ?     Grimacing, chewing, lip smacking, drooling, tongue biting, or loss of bowel or bladder control.    After a seizure, you may feel sleepy for a while.?    DIAGNOSIS    Your health care provider will ask about your symptoms and take a medical history. Descriptions from any witnesses to your seizures will be very helpful in the diagnosis. A physical exam, including a detailed neurological exam, is necessary. Various tests may be done, such as:      An electroencephalogram (EEG). This is a painless test of your brain waves. In this test, a diagram is created of your brain waves. These diagrams can be interpreted by a specialist.      An MRI of the brain. ?     A CT scan of the brain. ?     A spinal tap (lumbar puncture, LP).     Blood tests to check for signs of infection or abnormal blood chemistry.     TREATMENT    There is no cure for epilepsy, but it is generally treatable. Once epilepsy is diagnosed, it is  important to begin treatment as soon as possible. For most people with epilepsy, seizures can be controlled with medicines. The following may also be used:     A pacemaker for the brain (vagus nerve stimulator) can be used for people with seizures that are not well controlled by medicine.      Surgery on the brain.     For some people, epilepsy eventually goes away.    HOME CARE INSTRUCTIONS     Follow your health care provider's recommendations on driving and safety in normal activities.      Get enough rest. Lack of sleep can cause seizures.      Only take over-the-counter or prescription medicines as directed by your health care provider. Take any prescribed medicine exactly as directed.     Avoid any known triggers of your seizures.     Keep a seizure diary. Record what you recall about any seizure, especially any possible trigger. ?     Make sure the people you live and work with know that you are prone to seizures. They should receive instructions on how to help you. In general, a witness to  a seizure should: ?    ? Cushion your head and body. ?    ? Turn you on your side. ?    ? Avoid unnecessarily restraining you. ?    ? Not place anything inside your mouth. ?    ? Call for emergency medical help if there is any question about what has occurred. ?     Follow up with your health care provider as directed. You may need regular blood tests to monitor the levels of your medicine. ?    SEEK MEDICAL CARE IF:     You develop signs of infection or other illness. This might increase the risk of a seizure. ?     You seem to be having more frequent seizures. ?     Your seizure pattern is changing. ?    SEEK IMMEDIATE MEDICAL CARE IF:     You have a seizure that does not stop after a few moments. ?     You have a seizure that causes any difficulty in breathing. ?     You have a seizure that results in a very severe headache. ?     You have a seizure that leaves you with the inability to speak or use a part of your body. ?    This information is not intended to replace advice given to you by your health care provider. Make sure you discuss any questions you have with your health care provider.    Document Released: 05/27/2005 Document Revised: 03/17/2013 Document Reviewed: 01/06/2013  Elsevier Interactive Patient Education ?2016 [employer]

## 2018-10-07 NOTE — ED Notes (Signed)
 ED Patient Summary       ;       [facility] Emergency Department  [address] [place], [place] [address]  [phone]  Discharge Instructions (Patient)  _______________________________________     Name: Stephen Harper, Stephen Harper  DOB:  July 12, 1985                   MRN: [id number]                   FIN: [phone]  Reason For Visit: Seizure; SEIZURE  Final Diagnosis: Breakthrough seizure     Visit Date: 10/07/2018 03:09:00  Address: [address] [facility] [address]  Phone: [phone]     Primary Care Provider:      Name: NECIA MICKEY LORETTO VEAR      Phone: [phone]        Emergency Department Providers:         Primary Physician:   MARINE ELSIE ONEIDA Florie Lionel would like to thank you for allowing us  to assist you with your healthcare needs. The following includes patient education materials and information regarding your injury/illness.     Follow-up Instructions: You were treated today on an emergency basis, it may be wise to contact your primary care provider to notify them of your visit today. You may have been referred to your regular doctor or a specialist, please follow up as instructed. If your condition worsens or you can't get in to see the doctor, contact the Emergency Department.              With: Address: When:   THEODOLPH JACOBS-MD, View Only Providers - No Access [address] [place], [place] [address]  [phone] Business (1) Within 2 to 4 days              Printed Prescriptions:    Patient Education Materials:  Discharge Orders          Discharge Patient 10/07/18 3:47:00 EDT  Discharge Patient 10/07/18 3:47:00 EDT         Comment:      Epilepsy     Epilepsy    Epilepsy is a disorder in which a person has repeated seizures over time. A seizure is a release of abnormal electrical activity in the brain. Seizures can cause a change in attention, behavior, or the ability to remain awake and alert (altered mental status). Seizures often involve uncontrollable  shaking (convulsions).     Most people with epilepsy lead normal lives. However, people with epilepsy are at an increased risk of falls, accidents, and injuries. Therefore, it is important to begin treatment right away.    CAUSES    Epilepsy has many possible causes. Anything that disturbs the normal pattern of brain cell activity can lead to seizures. This may include:      Head injury.      Birth trauma.     High fever as a child.     Stroke.     Bleeding into or around the brain.     Certain drugs.     Prolonged low oxygen, such as what occurs after CPR efforts.      Abnormal brain development.      Certain illnesses, such as meningitis, encephalitis (brain infection), malaria, and other infections.      An imbalance of nerve signaling chemicals (neurotransmitters). ?    SIGNS AND SYMPTOMS  The symptoms of a seizure can vary greatly from one person to another. Right before a seizure, you may have a warning (aura) that a seizure is about to occur. An aura may include the following symptoms:     Fear or anxiety.     Nausea.     Feeling like the room is spinning (vertigo).     Vision changes, such as seeing flashing lights or spots.    Common symptoms during a seizure include:     Abnormal sensations, such as an abnormal smell or a bitter taste in the mouth. ?     Sudden, general body stiffness. ?     Convulsions that involve rhythmic jerking of the face, arm, or leg on one or both sides. ?     Sudden change in consciousness. ?    ? Appearing to be awake but not responding. ?    ? Appearing to be asleep but cannot be awakened. ?     Grimacing, chewing, lip smacking, drooling, tongue biting, or loss of bowel or bladder control.    After a seizure, you may feel sleepy for a while.?    DIAGNOSIS    Your health care provider will ask about your symptoms and take a medical history. Descriptions from any witnesses to your seizures will be very helpful in the diagnosis. A physical exam, including a detailed neurological  exam, is necessary. Various tests may be done, such as:      An electroencephalogram (EEG). This is a painless test of your brain waves. In this test, a diagram is created of your brain waves. These diagrams can be interpreted by a specialist.      An MRI of the brain. ?     A CT scan of the brain. ?     A spinal tap (lumbar puncture, LP).     Blood tests to check for signs of infection or abnormal blood chemistry.     TREATMENT    There is no cure for epilepsy, but it is generally treatable. Once epilepsy is diagnosed, it is important to begin treatment as soon as possible. For most people with epilepsy, seizures can be controlled with medicines. The following may also be used:     A pacemaker for the brain (vagus nerve stimulator) can be used for people with seizures that are not well controlled by medicine.      Surgery on the brain.     For some people, epilepsy eventually goes away.    HOME CARE INSTRUCTIONS     Follow your health care provider's recommendations on driving and safety in normal activities.      Get enough rest. Lack of sleep can cause seizures.      Only take over-the-counter or prescription medicines as directed by your health care provider. Take any prescribed medicine exactly as directed.     Avoid any known triggers of your seizures.     Keep a seizure diary. Record what you recall about any seizure, especially any possible trigger. ?     Make sure the people you live and work with know that you are prone to seizures. They should receive instructions on how to help you. In general, a witness to a seizure should: ?    ? Cushion your head and body. ?    ? Turn you on your side. ?    ? Avoid unnecessarily restraining you. ?    ? Not place anything inside your mouth. ?    ?  Call for emergency medical help if there is any question about what has occurred. ?     Follow up with your health care provider as directed. You may need regular blood tests to monitor the levels of your medicine. ?    SEEK  MEDICAL CARE IF:     You develop signs of infection or other illness. This might increase the risk of a seizure. ?     You seem to be having more frequent seizures. ?     Your seizure pattern is changing. ?    SEEK IMMEDIATE MEDICAL CARE IF:     You have a seizure that does not stop after a few moments. ?     You have a seizure that causes any difficulty in breathing. ?     You have a seizure that results in a very severe headache. ?     You have a seizure that leaves you with the inability to speak or use a part of your body. ?    This information is not intended to replace advice given to you by your health care provider. Make sure you discuss any questions you have with your health care provider.    Document Released: 05/27/2005 Document Revised: 03/17/2013 Document Reviewed: 01/06/2013  Elsevier Interactive Patient Education ?2016 [employer]         Allergy Info: No Known Allergies     Medication Information:  [facility] ED Physicians provided you with a complete list of medications post discharge, if you have been instructed to stop taking a medication please ensure you also follow up with this information to your Primary Care Physician.  Unless otherwise noted, patient will continue to take medications as prescribed prior to the Emergency Room visit.  Any specific questions regarding your chronic medications and dosages should be discussed with your physician(s) and pharmacist.          divalproex sodium (Depakote 500 mg oral delayed release tablet) Oral (given by mouth) 3 times a day.      Medications Administered During Visit:              Medication Dose Route   divalproex sodium 500 mg Oral          Major Tests and Procedures:  The following procedures and tests were performed during your ED visit.  COMMON PROCEDURES%>  COMMON PROCEDURES COMMENTS%>          Laboratory Orders  No laboratory orders were placed.              Radiology Orders  No radiology orders were placed.              Patient Care  Orders  Name Status Details   Discharge Patient Ordered 10/07/18 3:47:00 EDT   Discharge Patient Ordered 10/07/18 3:47:00 EDT   ED Assessment Adult Completed 10/07/18 3:15:35 EDT, 10/07/18 3:15:35 EDT   ED Secondary Triage Completed 10/07/18 3:15:35 EDT, 10/07/18 3:15:35 EDT   ED Triage Adult Completed 10/07/18 3:09:15 EDT, 10/07/18 3:09:15 EDT       ---------------------------------------------------------------------------------------------------------------------  [facility] allows you to manage your health, view your test results, and retrieve your discharge documents from your hospital stay securely and conveniently from your computer.     To begin the enrollment process, visit [email or web address] Click on "Sign up now" under [facility].   Comment:

## 2018-10-07 NOTE — ED Provider Notes (Signed)
Seizure *ED        Patient:   Stephen Harper, Stephen Harper            MRN: [id number]            FIN: [id number]               Age:   [age] years     Sex:  Male     DOB:  06/24/1985   Associated Diagnoses:   Breakthrough seizure   Author:   Orinda KennerIVERS-MD,  Sidda Humm T      Basic Information   Time seen: Provider Seen (ST)   ED Provider/Time:    Orinda KennerIVERS-MD,  Algenis Ballin T / 10/07/2018 03:09  .   Additional information: Chief Complaint from Nursing Triage Note   Chief Complaint  Chief Complaint: Pt states he had a seizure while sleeping that was witnessed by his mother whome states was tonic/clonic and lasted 2 minutes. No complaints at this time. (10/07/18 03:11:00).      History of Present Illness   Presents with This is a 33 year old male with history of epilepsy.  Patient states he takes Depakote for his seizures.  States he last had a seizure approximately 2 weeks ago.  States he has been compliant with his medication.  Denies any fevers, chills, cough, congestion.  No chest pain or shortness of breath.  No abdominal pain, nausea, vomiting.  Patient reportedly had a several minute tonic-clonic type seizure just prior to arrival.  EMS got there, the patient was alert and oriented x4.  Patient presents here for further evaluation.  His fingerstick blood sugar was 103 in route..        Review of Systems   Constitutional symptoms:  No fever, no chills.    Skin symptoms:  No rash, no lesion.    Eye symptoms:  No recent vision problems, no blurred vision.    Respiratory symptoms:  No shortness of breath, no cough.    Gastrointestinal symptoms:  No abdominal pain, no nausea, no vomiting.    Neurologic symptoms:  No headache, no dizziness, no altered level of consciousness.       Health Status   Allergies:    Allergic Reactions (Selected)  No Known Allergies.   Medications:  (Selected)   Inpatient Medications  Ordered  Depakote: 500 mg, 1 tabs, Oral, Once  Documented Medications  Documented  Depakote 500 mg oral delayed release tablet: mg, tabs,  Oral, TID, 0 Refill(s).      Past Medical/ Family/ Social History   Surgical history: Reviewed as documented in chart.   Family history: Reviewed as documented in chart.   Social history: Reviewed as documented in chart.   Problem list:    Active Problems (1)  Seizure   .      Physical Examination               Vital Signs   Vital Signs   10/07/2018 3:11 EDT Systolic Blood Pressure 113 mmHg    Diastolic Blood Pressure 73 mmHg    Temperature Oral 37 degC    Heart Rate Monitored 94 bpm    Respiratory Rate 18 br/min    SpO2 100 %    Measurements   10/07/2018 3:15 EDT Body Mass Index est meas 21.88 kg/m2    Body Mass Index Measured 21.88 kg/m2   10/07/2018 3:11 EDT Height/Length Measured 184.9 cm    Weight Dosing 74.8 kg    Basic Oxygen Information   10/07/2018 3:11 EDT SpO2  100 %    Oxygen Therapy Room air    General:  Alert, no acute distress.    Glasgow coma scale:  Total score: Total score: 15.   Neurological:  Alert and oriented to person, place, time, and situation, No focal neurological deficit observed, normal sensory observed, normal motor observed, normal speech observed.    Skin:  Warm, dry, intact.    Head:  Normocephalic, atraumatic.    Eye:  Extraocular movements are intact, normal conjunctiva.    Musculoskeletal:  No swelling, no deformity.       Medical Decision Making   Rationale:  33 year old male with history of epilepsy.  We will give him a dose of Depakote here in the emergency department.  We will watch him to ensure no further seizure activity..      Impression and Plan   Diagnosis   Breakthrough seizure (ICD10-CM G40.919, Discharge, Medical)   Plan   Condition: Stable.    Disposition: Discharged: Time  10/07/2018 03:51:00, to home.    Patient was given the following educational materials: Epilepsy.    Follow up with: THEODOLPH JACOBS-MD, View Only Providers - No Access Within 2 to 4 days.    Counseled: Patient, Regarding diagnosis, Regarding diagnostic results, Regarding treatment plan, Patient  indicated understanding of instructions.    Signature Line     Electronically Signed on 10/07/2018 03:52 AM EDT   ________________________________________________   Orinda Kenner T               Modified by: Orinda Kenner T on 10/07/2018 03:52 AM EDT

## 2018-10-07 NOTE — ED Notes (Signed)
ED Pre-Arrival Note        Pre-Arrival Summary    Name:  CC10,    Current Date:  10/07/2018 03:11:27 EDT  Gender:  Male  Date of Birth:    Age:  33  Pre-Arrival Type:  EMS  ETA:  10/07/2018 03:31:00 EDT  Primary Care Physician:    Presenting Problem:  seizure  Pre-Arrival User:  Lajean Saver RN, Tad Moore J  Referring Source:    Location:  [place]  BP:  114/72  HR:  90  O2:  99            PreArrival Communication Form  Emergency Department        Additional Patient Information:        Orders:  [    ] CBC                                            [     ] CT Head no contrast  [    ] BMP                                           [     ] CT Abdomen/Pelvis no contrast  [    ] PT/INR                                       [     ] CT Abdomen/Pelvis IV contrast, w/ oral contrast  [    ] Troponin                                   [     ] CT Abdomen/Pelvis IV contrast, no oral contrast  [    ] BNP                                            [     ] See ordersheet  [    ] CXR                                             [     ] Other:__________________________  [    ] EKG

## 2018-10-07 NOTE — ED Notes (Signed)
ED Triage Note       ED Secondary Triage Entered On:  10/07/2018 3:15 EDT    Performed On:  10/07/2018 3:15 EDT by Oswaldo Done, RN, Charyl Dancer               General Information   Barriers to Learning :   None evident   ED Home Meds Section :   Document assessment   [facility] ED Fall Risk Section :   Document assessment   ED Advance Directives Section :   Document assessment   Netta Corrigan - 10/07/2018 3:15 EDT   (As Of: 10/07/2018 03:15:53 EDT)   Problems(Active)    Seizure (SNOMED CT  :[id number] )  Name of Problem:   Seizure ; Recorder:   Oswaldo Done RN, Charyl Dancer; Confirmation:   Confirmed ; Classification:   Patient Stated ; Code:   [id number] ; Contributor System:   Dietitian ; Last Updated:   10/07/2018 3:13 EDT ; Life Cycle Date:   10/07/2018 ; Life Cycle Status:   Active ; Vocabulary:   SNOMED CT          Diagnoses(Active)    Breakthrough seizure  Date:   10/07/2018 ; Diagnosis Type:   Discharge ; Confirmation:   Confirmed ; Clinical Dx:   Breakthrough seizure ; Classification:   Medical ; Clinical Service:   Non-Specified ; Code:   ICD-10-CM ; Probability:   0 ; Diagnosis Code:   G40.919      Seizure  Date:   10/07/2018 ; Diagnosis Type:   Reason For Visit ; Confirmation:   Complaint of ; Clinical Dx:   Seizure ; Classification:   Medical ; Clinical Service:   Non-Specified ; Code:   PNED ; Probability:   0 ; Diagnosis Code:   [id number]             -    Procedure History   (As Of: 10/07/2018 03:15:53 EDT)     [facility] Perch Fall Risk Assessment Tool   Hx of falling last 3 months ED Fall :   No   Patient confused or disoriented ED Fall :   No   Patient intoxicated or sedated ED Fall :   No   Patient impaired gait ED Fall :   No   Use a mobility assistance device ED Fall :   No   Patient altered elimination ED Fall :   No   [facility] ED Fall Score :   0    Netta Corrigan 10/07/2018 3:15 EDT   ED Advance Directive   Advance Directive :   No   Oswaldo Done RN, Charyl Dancer - 10/07/2018 3:15 EDT

## 2018-10-07 NOTE — Discharge Summary (Signed)
ED Clinical Summary                        [facility]  [address]  [place], [place] [address]  [phone]           PERSON INFORMATION  Name: Stephen Harper Age:  33 Years DOB: [birth year]   Sex: Male Language: English PCP: Renne Musca H   Marital Status: Single Phone: [phone] Med Service: MED-Medicine   MRN: [id number] Acct# [id number] Arrival: 10/07/2018 03:09:00   Visit Reason: Seizure; SEIZURE Acuity: 3 LOS: 000 00:56   Address:    [address] [place] [address]   Diagnosis:    Breakthrough seizure  Medications:          Medications that have not changed  Other Medications  divalproex sodium (Depakote 500 mg oral delayed release tablet) Oral (given by mouth) 3 times a day.  Last Dose:____________________      Medications Administered During Visit:                Medication Dose Route   divalproex sodium 500 mg Oral               Allergies      No Known Allergies      Major Tests and Procedures:  The following procedures and tests were performed during your ED visit.  COMMON PROCEDURES%>  COMMON PROCEDURES COMMENTS%>                PROVIDER INFORMATION               Provider Role Assigned Deberah Castle ED Provider 10/07/2018 03:09:26    Oswaldo Done RN, Charyl Dancer ED Nurse 10/07/2018 03:11:37        Attending Physician:  Orinda Kenner T      Admit Doc  RIVERS-MD,  Teresita Madura     Consulting Doc       VITALS INFORMATION  Vital Sign Triage Latest   Temp Oral ORAL_1%> ORAL%>   Temp Temporal TEMPORAL_1%> TEMPORAL%>   Temp Intravascular INTRAVASCULAR_1%> INTRAVASCULAR%>   Temp Axillary AXILLARY_1%> AXILLARY%>   Temp Rectal RECTAL_1%> RECTAL%>   02 Sat 100 % 97 %   Respiratory Rate RATE_1%> RATE%>   Peripheral Pulse Rate PULSE RATE_1%> PULSE RATE%>   Apical Heart Rate HEART RATE_1%> HEART RATE%>   Blood Pressure BLOOD PRESSURE_1%>/ BLOOD PRESSURE_1%>73 mmHg BLOOD PRESSURE%> / BLOOD PRESSURE%>67 mmHg                 Immunizations      No Immunizations  Documented This Visit          DISCHARGE INFORMATION   Discharge Disposition: H Outpt-Sent Home   Discharge Location:  Home   Discharge Date and Time:  10/07/2018 04:05:29   ED Checkout Date and Time:  10/07/2018 04:05:29     DEPART REASON INCOMPLETE INFORMATION               Depart Action Incomplete Reason   Interactive View/I&O Recently assessed               Problems      No Problems Documented              Smoking Status      No Smoking Status Documented         PATIENT EDUCATION INFORMATION  Instructions:     Epilepsy  Follow up:                   With: Address: When:   THEODOLPH JACOBS-MD, View Only Providers - No Access [address] [place], [place] [id number]  [phone] Business (1) Within 2 to 4 days              ED PROVIDER DOCUMENTATION     Patient:   Stephen Harper, Stephen Harper            MRN: [id number]            FIN: [id number]               Age:   [age] years     Sex:  Male     DOB:  05/08/1986   Associated Diagnoses:   Breakthrough seizure   Author:   Orinda KennerIVERS-MD,  WILLIAM T      Basic Information   Time seen: Provider Seen (ST)   ED Provider/Time:    Orinda KennerIVERS-MD,  WILLIAM T / 10/07/2018 03:09  .   Additional information: Chief Complaint from Nursing Triage Note   Chief Complaint  Chief Complaint: Pt states he had a seizure while sleeping that was witnessed by his mother whome states was tonic/clonic and lasted 2 minutes. No complaints at this time. (10/07/18 03:11:00).      History of Present Illness   Presents with This is a 33 year old male with history of epilepsy.  Patient states he takes Depakote for his seizures.  States he last had a seizure approximately 2 weeks ago.  States he has been compliant with his medication.  Denies any fevers, chills, cough, congestion.  No chest pain or shortness of breath.  No abdominal pain, nausea, vomiting.  Patient reportedly had a several minute tonic-clonic type seizure just prior to arrival.  EMS got there, the patient was alert and oriented x4.  Patient  presents here for further evaluation.  His fingerstick blood sugar was 103 in route..        Review of Systems   Constitutional symptoms:  No fever, no chills.    Skin symptoms:  No rash, no lesion.    Eye symptoms:  No recent vision problems, no blurred vision.    Respiratory symptoms:  No shortness of breath, no cough.    Gastrointestinal symptoms:  No abdominal pain, no nausea, no vomiting.    Neurologic symptoms:  No headache, no dizziness, no altered level of consciousness.       Health Status   Allergies:    Allergic Reactions (Selected)  No Known Allergies.   Medications:  (Selected)   Inpatient Medications  Ordered  Depakote: 500 mg, 1 tabs, Oral, Once  Documented Medications  Documented  Depakote 500 mg oral delayed release tablet: mg, tabs, Oral, TID, 0 Refill(s).      Past Medical/ Family/ Social History   Surgical history: Reviewed as documented in chart.   Family history: Reviewed as documented in chart.   Social history: Reviewed as documented in chart.   Problem list:    Active Problems (1)  Seizure   .      Physical Examination               Vital Signs   Vital Signs   10/07/2018 3:11 EDT Systolic Blood Pressure 113 mmHg    Diastolic Blood Pressure 73 mmHg    Temperature Oral 37 degC    Heart Rate Monitored 94 bpm    Respiratory Rate  18 br/min    SpO2 100 %    Measurements   10/07/2018 3:15 EDT Body Mass Index est meas 21.88 kg/m2    Body Mass Index Measured 21.88 kg/m2   10/07/2018 3:11 EDT Height/Length Measured 184.9 cm    Weight Dosing 74.8 kg    Basic Oxygen Information   10/07/2018 3:11 EDT SpO2 100 %    Oxygen Therapy Room air    General:  Alert, no acute distress.    Glasgow coma scale:  Total score: Total score: 15.   Neurological:  Alert and oriented to person, place, time, and situation, No focal neurological deficit observed, normal sensory observed, normal motor observed, normal speech observed.    Skin:  Warm, dry, intact.    Head:  Normocephalic, atraumatic.    Eye:  Extraocular movements  are intact, normal conjunctiva.    Musculoskeletal:  No swelling, no deformity.       Medical Decision Making   Rationale:  33 year old male with history of epilepsy.  We will give him a dose of Depakote here in the emergency department.  We will watch him to ensure no further seizure activity..      Impression and Plan   Diagnosis   Breakthrough seizure (ICD10-CM G40.919, Discharge, Medical)   Plan   Condition: Stable.    Disposition: Discharged: Time  10/07/2018 03:51:00, to home.    Patient was given the following educational materials: Epilepsy.    Follow up with: THEODOLPH JACOBS-MD, View Only Providers - No Access Within 2 to 4 days.    Counseled: Patient, Regarding diagnosis, Regarding diagnostic results, Regarding treatment plan, Patient indicated understanding of instructions.

## 2018-10-07 NOTE — ED Notes (Signed)
ED Triage Note       ED Triage Adult Entered On:  10/07/2018 3:15 EDT    Performed On:  10/07/2018 3:11 EDT by Oswaldo DoneVincent, RN, Charyl DancerAndrea C               Triage   Chief Complaint :   Pt states he had a seizure while sleeping that was witnessed by his mother whome states was tonic/clonic and lasted 2 minutes. No complaints at this time.    Numeric Rating Pain Scale :   0 = No pain   [place] Mode of Arrival :   Private vehicle   Infectious Disease Documentation :   Document assessment   Temperature Oral :   37 degC(Converted to: 98.6 degF)    Heart Rate Monitored :   94 bpm   Respiratory Rate :   18 br/min   Systolic Blood Pressure :   113 mmHg   Diastolic Blood Pressure :   73 mmHg   SpO2 :   100 %   Oxygen Therapy :   Room air   Patient presentation :   None of the above   Chief Complaint or Presentation suggest infection :   No   Weight Dosing :   74.8 kg(Converted to: 164 lb 14 oz)    Height :   184.9 cm(Converted to: 6 ft 1 in)    Body Mass Index Dosing :   22 kg/m2   Netta CorriganVincent, RN, Andrea C - 10/07/2018 3:11 EDT   DCP GENERIC CODE   Tracking Acuity :   3   Tracking Group :   ED [facility]   [place]Vincent, RCharyl Dancer, Andrea C - 10/07/2018 3:11 EDT   ED General Section :   Document assessment   Pregnancy Status :   N/A   ED Allergies Section :   Document assessment   ED Reason for Visit Section :   Document assessment   ED Home Meds Section :   Document assessment   Netta CorriganVincent, RN, Andrea C - 10/07/2018 3:11 EDT   ID Risk Screen Symptoms   Recent Travel History :   No recent travel   Close Contact with COVID-19  ID :   No   Have you been tested for COVID-19 ID :   No   Oswaldo DoneVincent, RCharyl Dancer, Andrea C - 10/07/2018 3:11 EDT   Allergies   (As Of: 10/07/2018 03:15:34 EDT)   Allergies (Active)   No Known Allergies  Estimated Onset Date:   Unspecified ; Created By:   Charlott RakesIVERS-MD,  WILLIAM T; Reaction Status:   Active ; Category:   Drug ; Substance:   No Known Allergies ; Type:   Allergy ; Updated By:   Charlott RakesIVERS-MD,  WILLIAM T; Reviewed Date:   10/07/2018  3:12 EDT        Psycho-Social   Last 3 mo, thoughts killing self/others :   Patient denies   Netta CorriganVincent, RN, Andrea C - 10/07/2018 3:11 EDT   ED Home Med List   Medication List   (As Of: 10/07/2018 03:15:34 EDT)   Normal Order    divalproex sodium 500 mg DR Tab  :   divalproex sodium 500 mg DR Tab ; Status:   Ordered ; Ordered As Mnemonic:   Depakote ; Simple Display Line:   500 mg, 1 tabs, Oral, Once ; Ordering Provider:   Charlott RakesIVERS-MD,  WILLIAM T; Catalog Code:   divalproex sodium ; Order Dt/Tm:   10/07/2018 03:09:39 EDT  Home Meds    divalproex sodium  :   divalproex sodium ; Status:   Documented ; Ordered As Mnemonic:   Depakote 500 mg oral delayed release tablet ; Simple Display Line:   mg, tabs, Oral, TID, 0 Refill(s) ; Catalog Code:   divalproex sodium ; Order Dt/Tm:   10/07/2018 03:14:30 EDT            ED Reason for Visit   (As Of: 10/07/2018 03:15:34 EDT)   Problems(Active)    Seizure (SNOMED CT  :[id number] )  Name of Problem:   Seizure ; Recorder:   Oswaldo Done, RN, Charyl Dancer; Confirmation:   Confirmed ; Classification:   Patient Stated ; Code:   [id number] ; Contributor System:   Dietitian ; Last Updated:   10/07/2018 3:13 EDT ; Life Cycle Date:   10/07/2018 ; Life Cycle Status:   Active ; Vocabulary:   SNOMED CT          Diagnoses(Active)    Breakthrough seizure  Date:   10/07/2018 ; Diagnosis Type:   Discharge ; Confirmation:   Confirmed ; Clinical Dx:   Breakthrough seizure ; Classification:   Medical ; Clinical Service:   Non-Specified ; Code:   ICD-10-CM ; Probability:   0 ; Diagnosis Code:   G40.919      Seizure  Date:   10/07/2018 ; Diagnosis Type:   Reason For Visit ; Confirmation:   Complaint of ; Clinical Dx:   Seizure ; Classification:   Medical ; Clinical Service:   Non-Specified ; Code:   PNED ; Probability:   0 ; Diagnosis Code:   [id number]

## 2019-09-19 IMAGING — DX DG CHEST 2V
2 series · 2 of 2 positions shown · non-contrast
Comparison: None.

CLINICAL DATA: Cough with brown mucus

EXAM:
CHEST - 2 VIEW

[w chest pa]
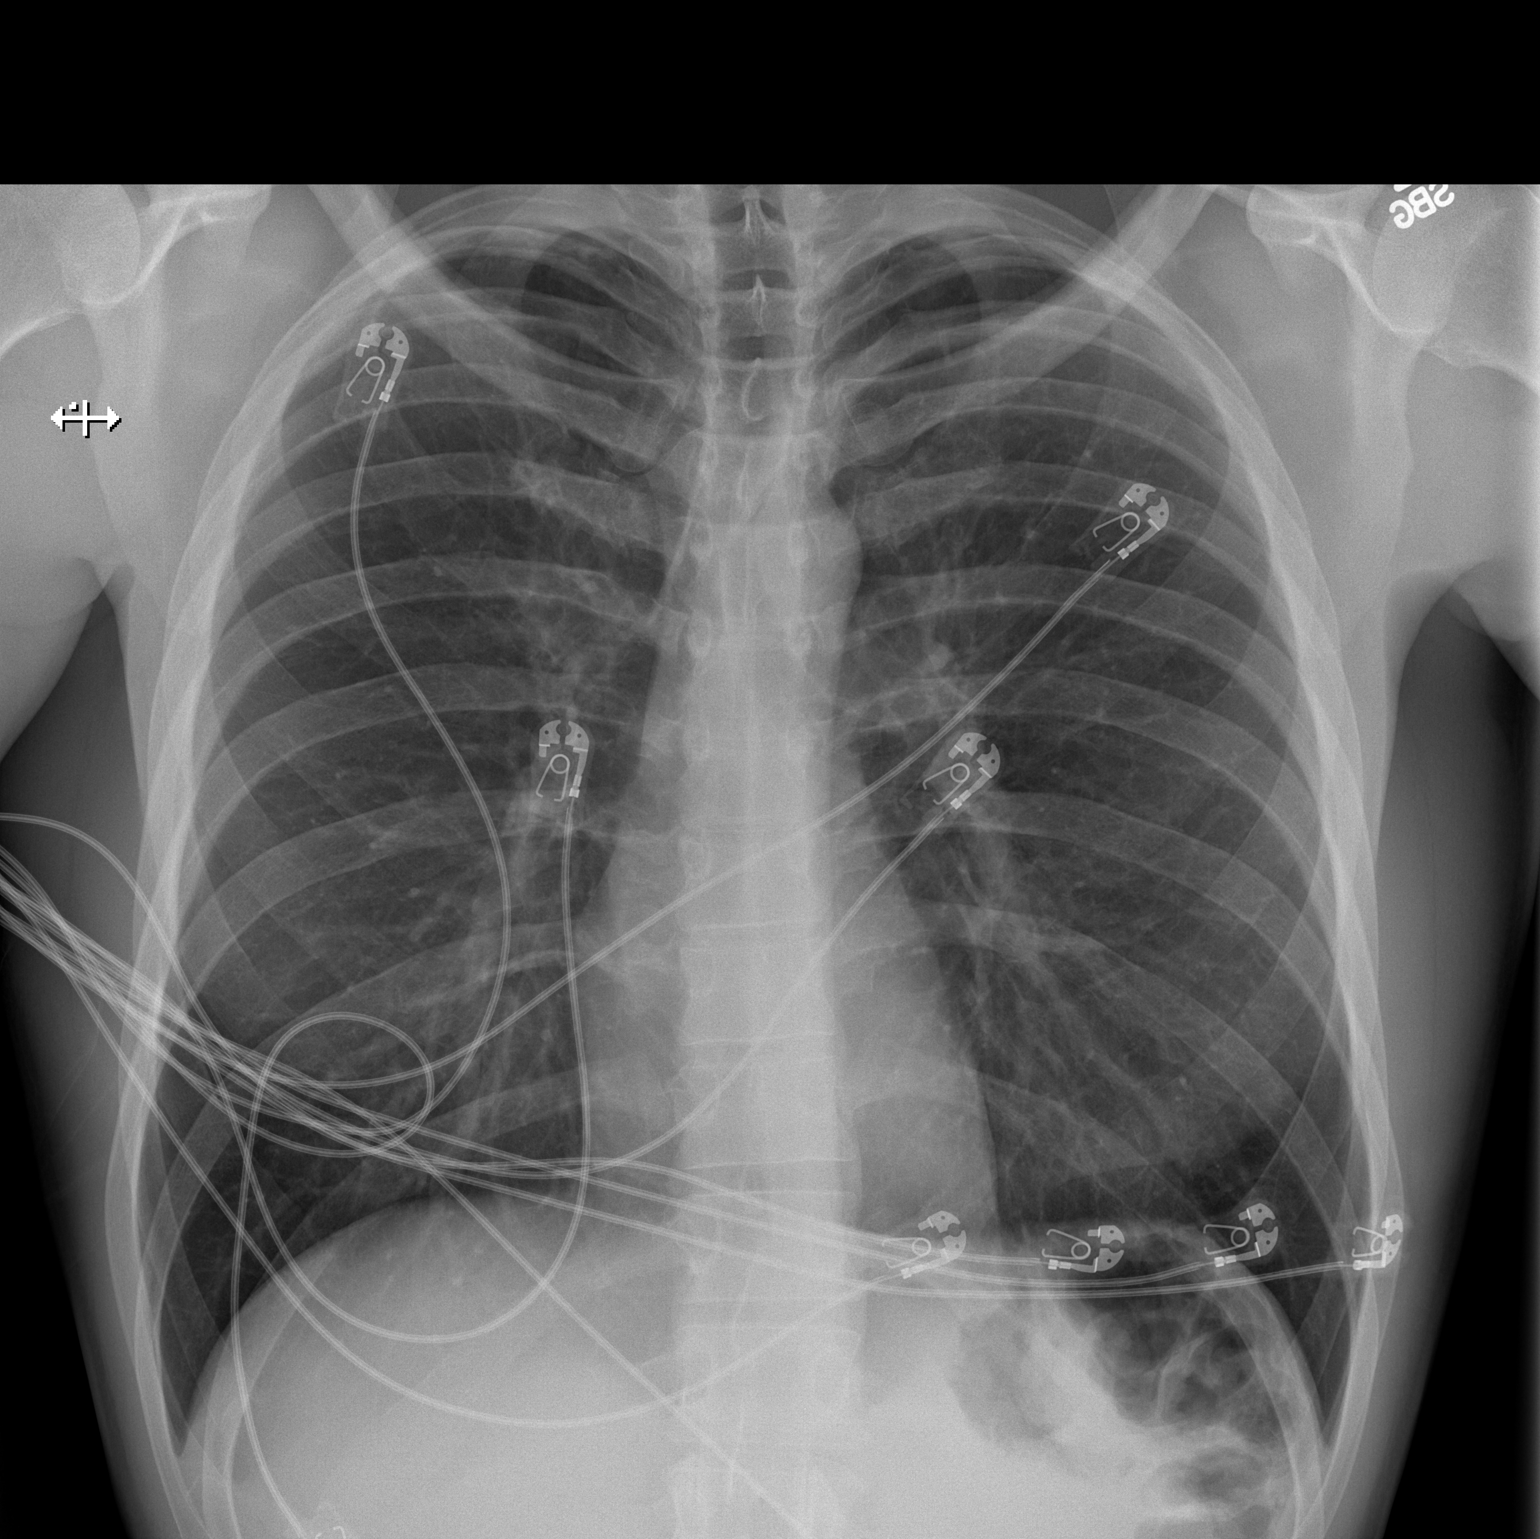

[w chest lat]
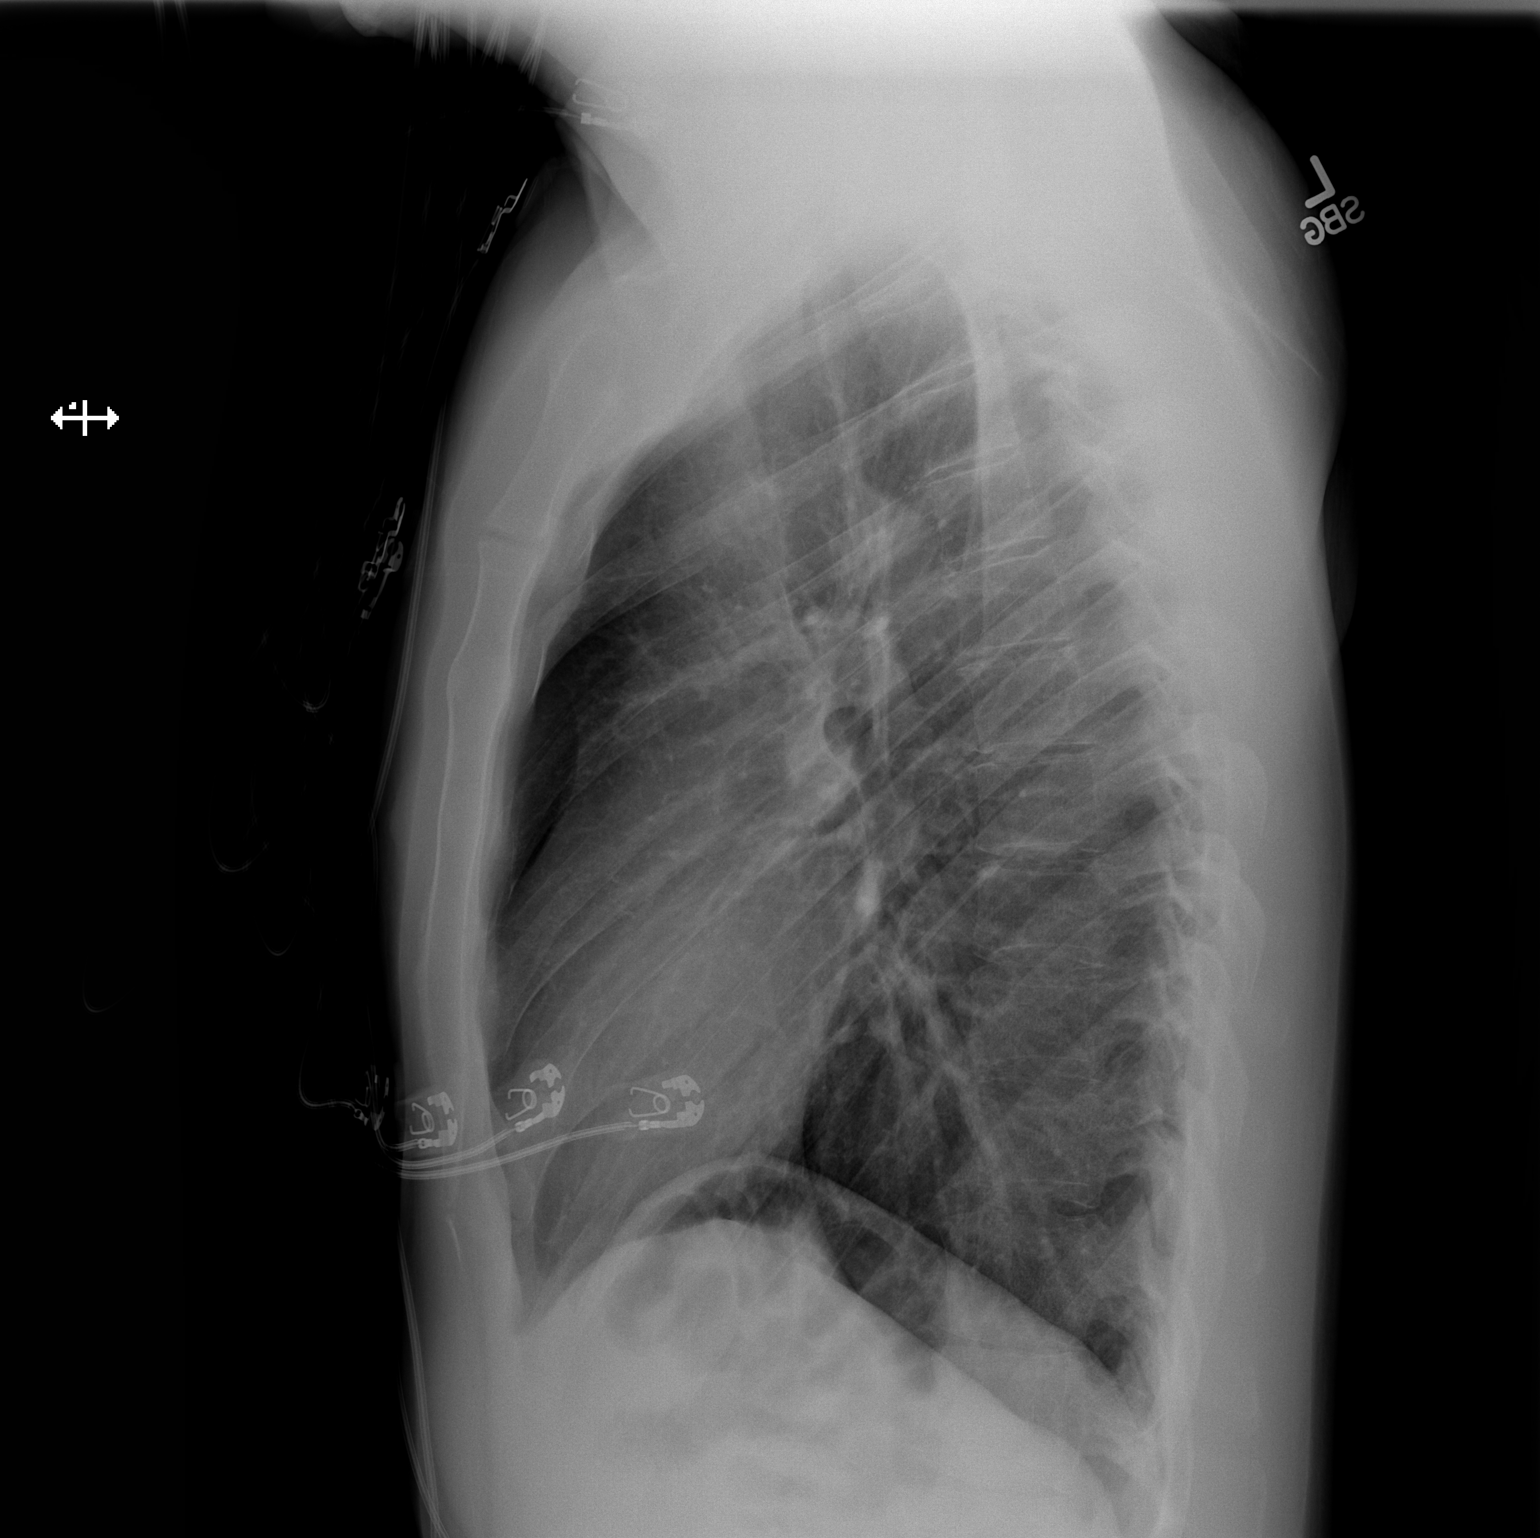

[2 of 2 positions shown; findings below may reference images not displayed]

FINDINGS: The heart size and mediastinal contours are within normal limits.
Both lungs are clear. The visualized skeletal structures are
unremarkable.
IMPRESSION: No active cardiopulmonary disease.

## 2019-09-22 IMAGING — CT CT ABD-PELV W/ CM
2 of 4 series · 15 of 46 positions shown, 17 images · IV contrast (Omni 300)
Comparison: 02/27/2018 CT

CLINICAL DATA: Lower abdominal pain with increased severity.
Difficulty voiding. History of gastric ulcers.

EXAM:
CT ABDOMEN AND PELVIS WITH CONTRAST
TECHNIQUE: Multidetector CT imaging of the abdomen and pelvis was performed
using the standard protocol following bolus administration of
intravenous contrast.
CONTRAST:  100mL OMNIPAQUE IOHEXOL 300 MG/ML  SOLN

[Series 3: a/p w/ 5mm · axial · 0.76mm/px · z∈[-657,-237]mm · 12 of 94 slices shown, 14 images]
[im 5/94  soft-tissue]
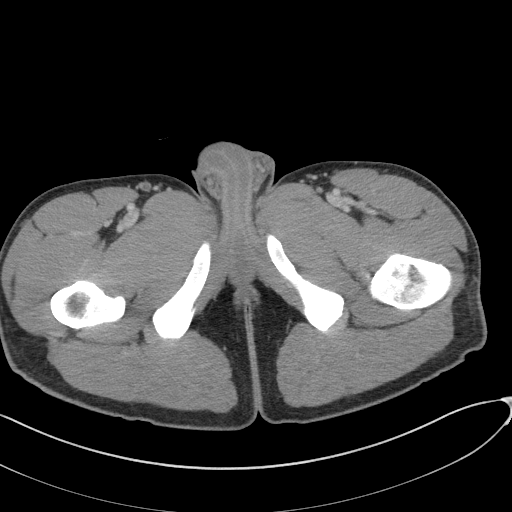
[im 5/94  bone]
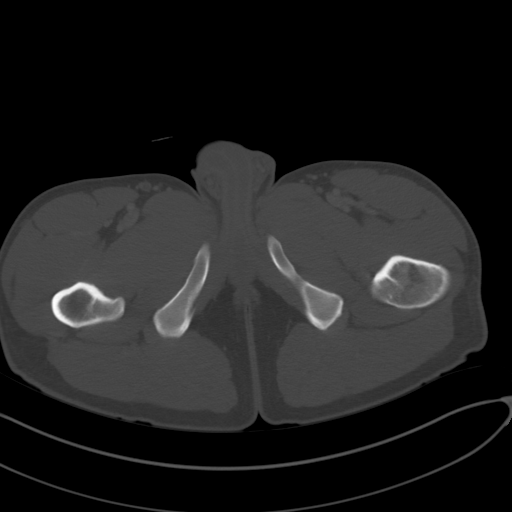
[im 13/94  soft-tissue]
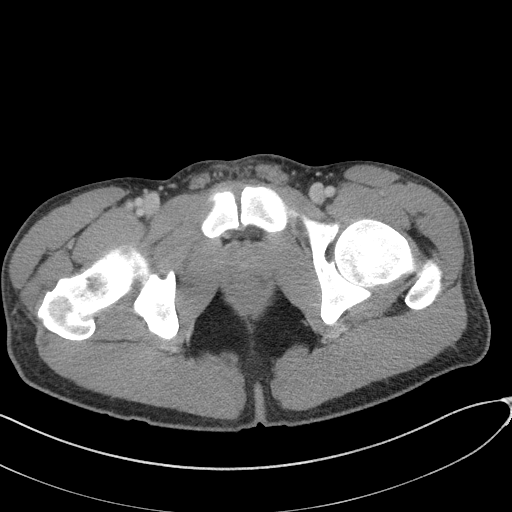
[im 22/94  soft-tissue]
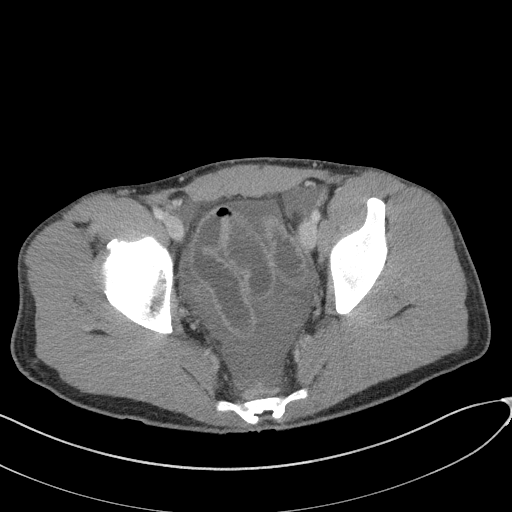
[im 30/94  soft-tissue]
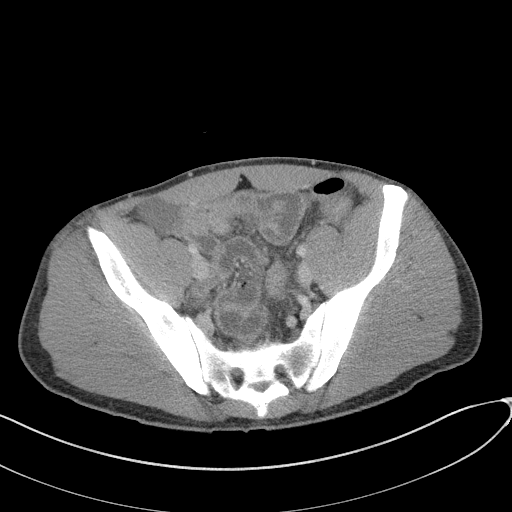
[im 34/94  soft-tissue]
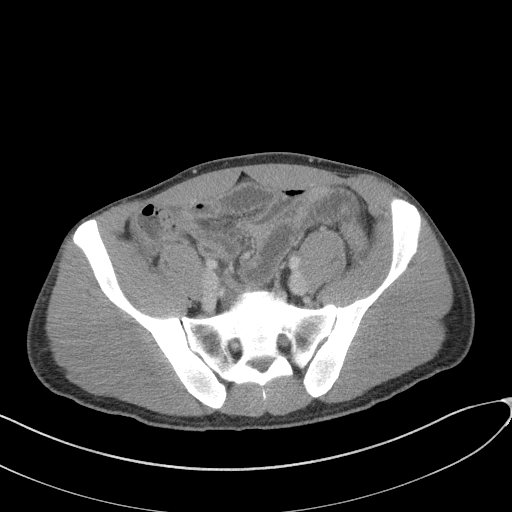
[im 43/94  soft-tissue]
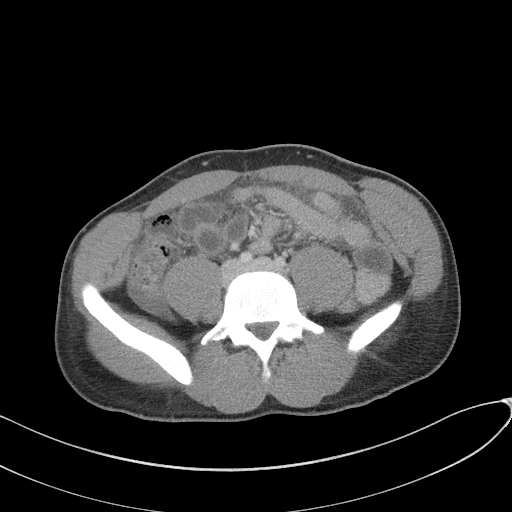
[im 51/94  soft-tissue]
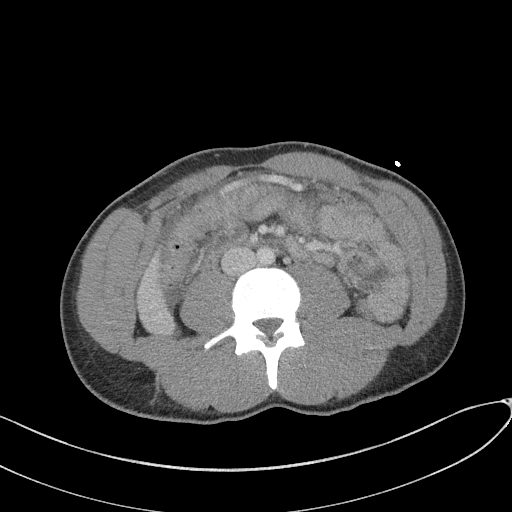
[im 60/94  soft-tissue]
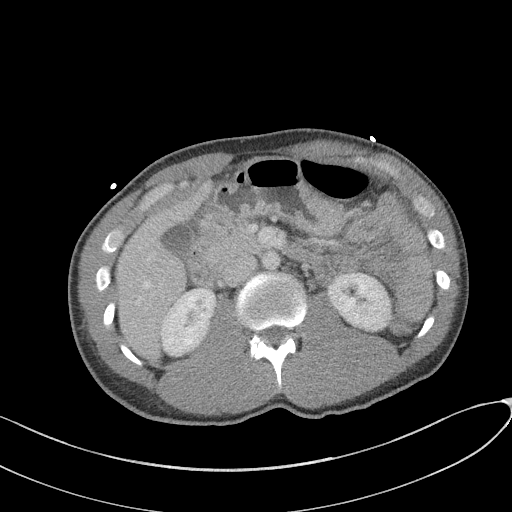
[im 64/94  soft-tissue]
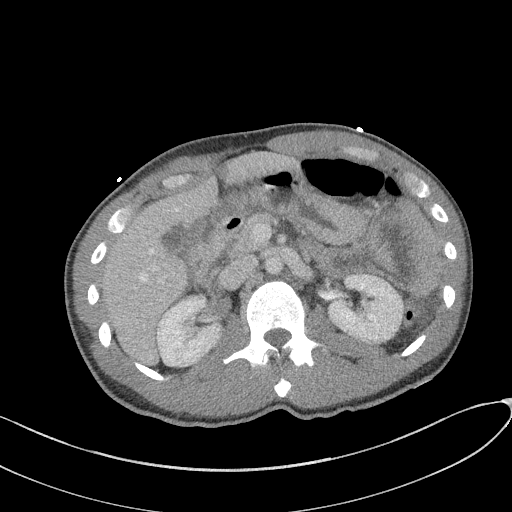
[im 64/94  bone]
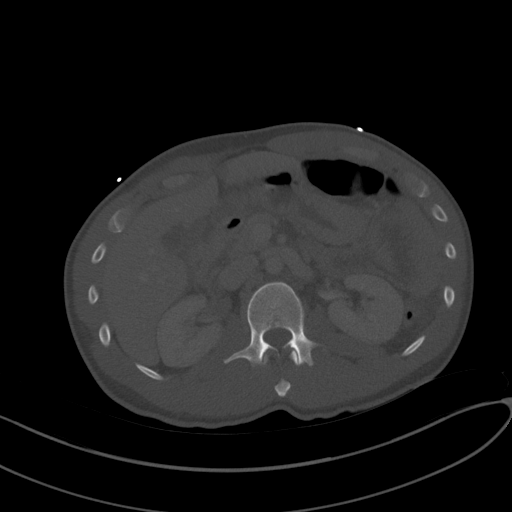
[im 72/94  soft-tissue]
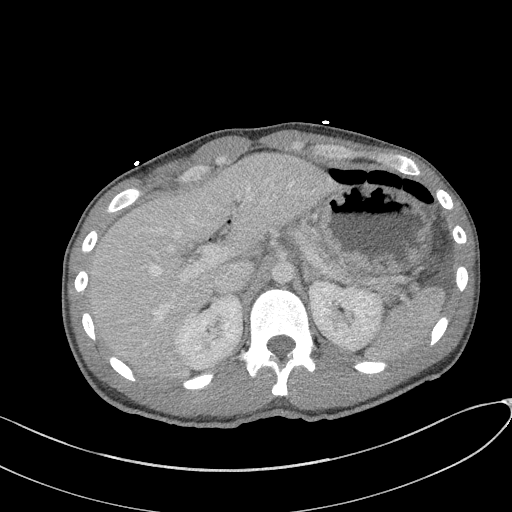
[im 81/94  soft-tissue]
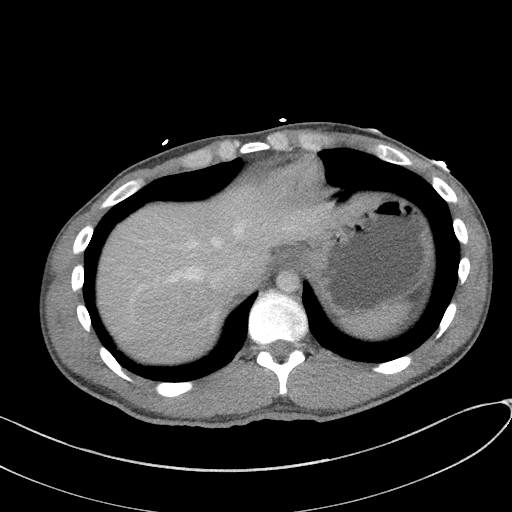
[im 89/94  soft-tissue]
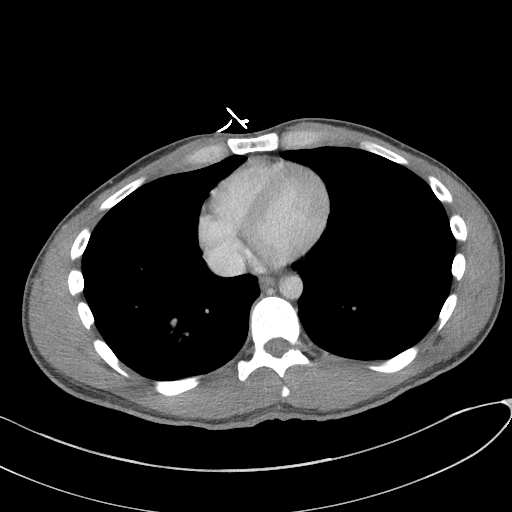

[Series 6: a/p w/ cor · coronal · 0.67mm/px · 3 of 115 slices shown]
[im 39/115  soft-tissue]
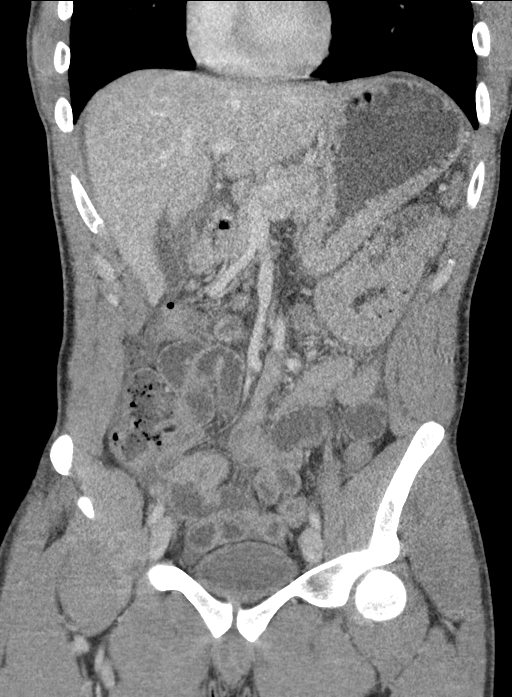
[im 51/115  soft-tissue]
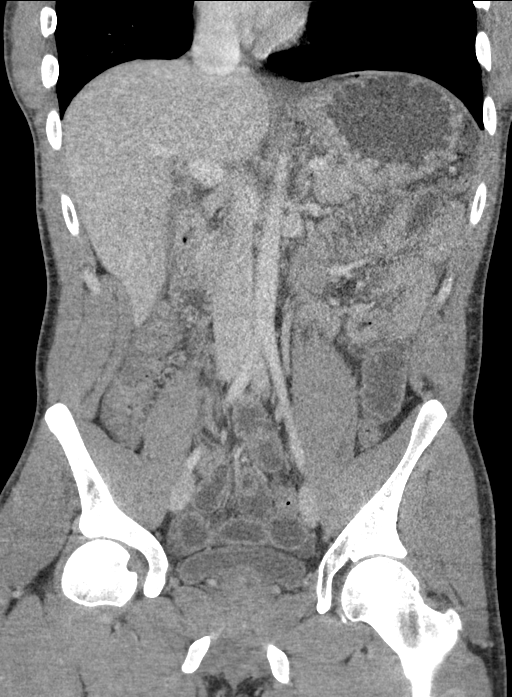
[im 64/115  soft-tissue]
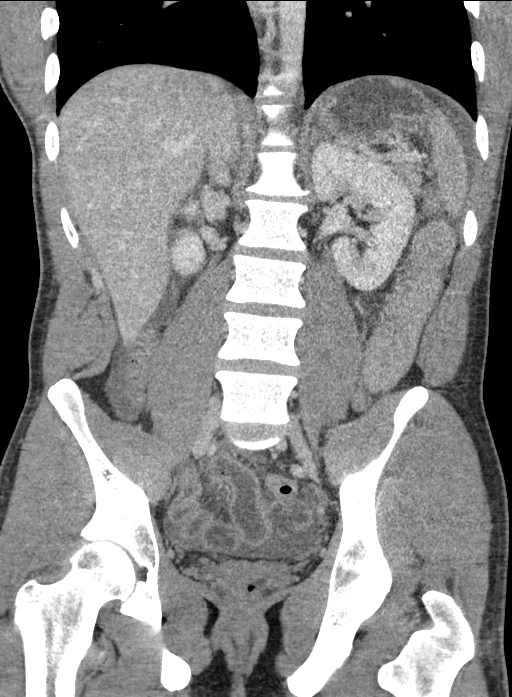

[15 of 46 positions shown; findings below may reference images not displayed]

FINDINGS: Lower chest: Normal heart size.  Clear lung bases.

Hepatobiliary: Homogeneous attenuation of the liver without
space-occupying mass. No biliary dilatation. The gallbladder is
physiologically distended without stones.

Pancreas: Unremarkable

Spleen: Normal size.

Adrenals/Urinary Tract: Normal bilateral adrenal glands. Punctate
calculi in the right kidney without obstructive uropathy. The
urinary bladder is unremarkable for the degree of distention.

Stomach/Bowel: Physiologic distention of the stomach. There is free
air in the upper abdomen along the falciform ligament and
subdiaphragmatic in etiology consistent with perforated hollow
viscus. Small to moderate amount of free intraperitoneal fluid is
noted with sympathetic dilatation of small bowel likely representing
small bowel ileus. The potential for peritonitis is of concern. What
appears to represent the appendix is normal. The colon is
decompressed in appearance.

Vascular/Lymphatic: No significant vascular findings are present. No
enlarged abdominal or pelvic lymph nodes.

Reproductive: Prostate is unremarkable.

Other: Small to moderate amount of free fluid is described. Small
amount of free air in the upper abdomen.

Musculoskeletal: Degenerative disc disease L1-2. No aggressive nor
acute osseous abnormality.
IMPRESSION: 1. A few dots of free intraperitoneal air noted beneath the
diaphragm and along the falciform ligament. Interval development of
a small to moderate amount of free fluid within the abdomen and
pelvis. Findings would be in keeping with perforated hollow viscus.
Exact site unfortunately is not identified but given history of
gastric ulcers, would consider starting there initially unless
clinical exam dictates otherwise. This would also corroborated with
air mostly seen in the upper abdomen though this may simply be due
to patient being mostly upright prior to imaging.
2. Concern for peritonitis given presence of small to moderate free
fluid within the abdomen and pelvis.
3. Probable sympathetic ileus causing dilated fluid-filled small
bowel without evidence of mechanical bowel obstruction.

These results were called by telephone at the time of interpretation
on 03/27/2018 at [DATE] to PA YUI MAN JUME , who verbally
acknowledged these results.

## 2019-09-25 IMAGING — DX DG CHEST 1V PORT
1 series · 1 of 1 positions shown · non-contrast
Comparison: 03/24/2018 chest radiograph

CLINICAL DATA: 32 y/o  M; pain with swallowing.

EXAM:
PORTABLE CHEST 1 VIEW

[chest]
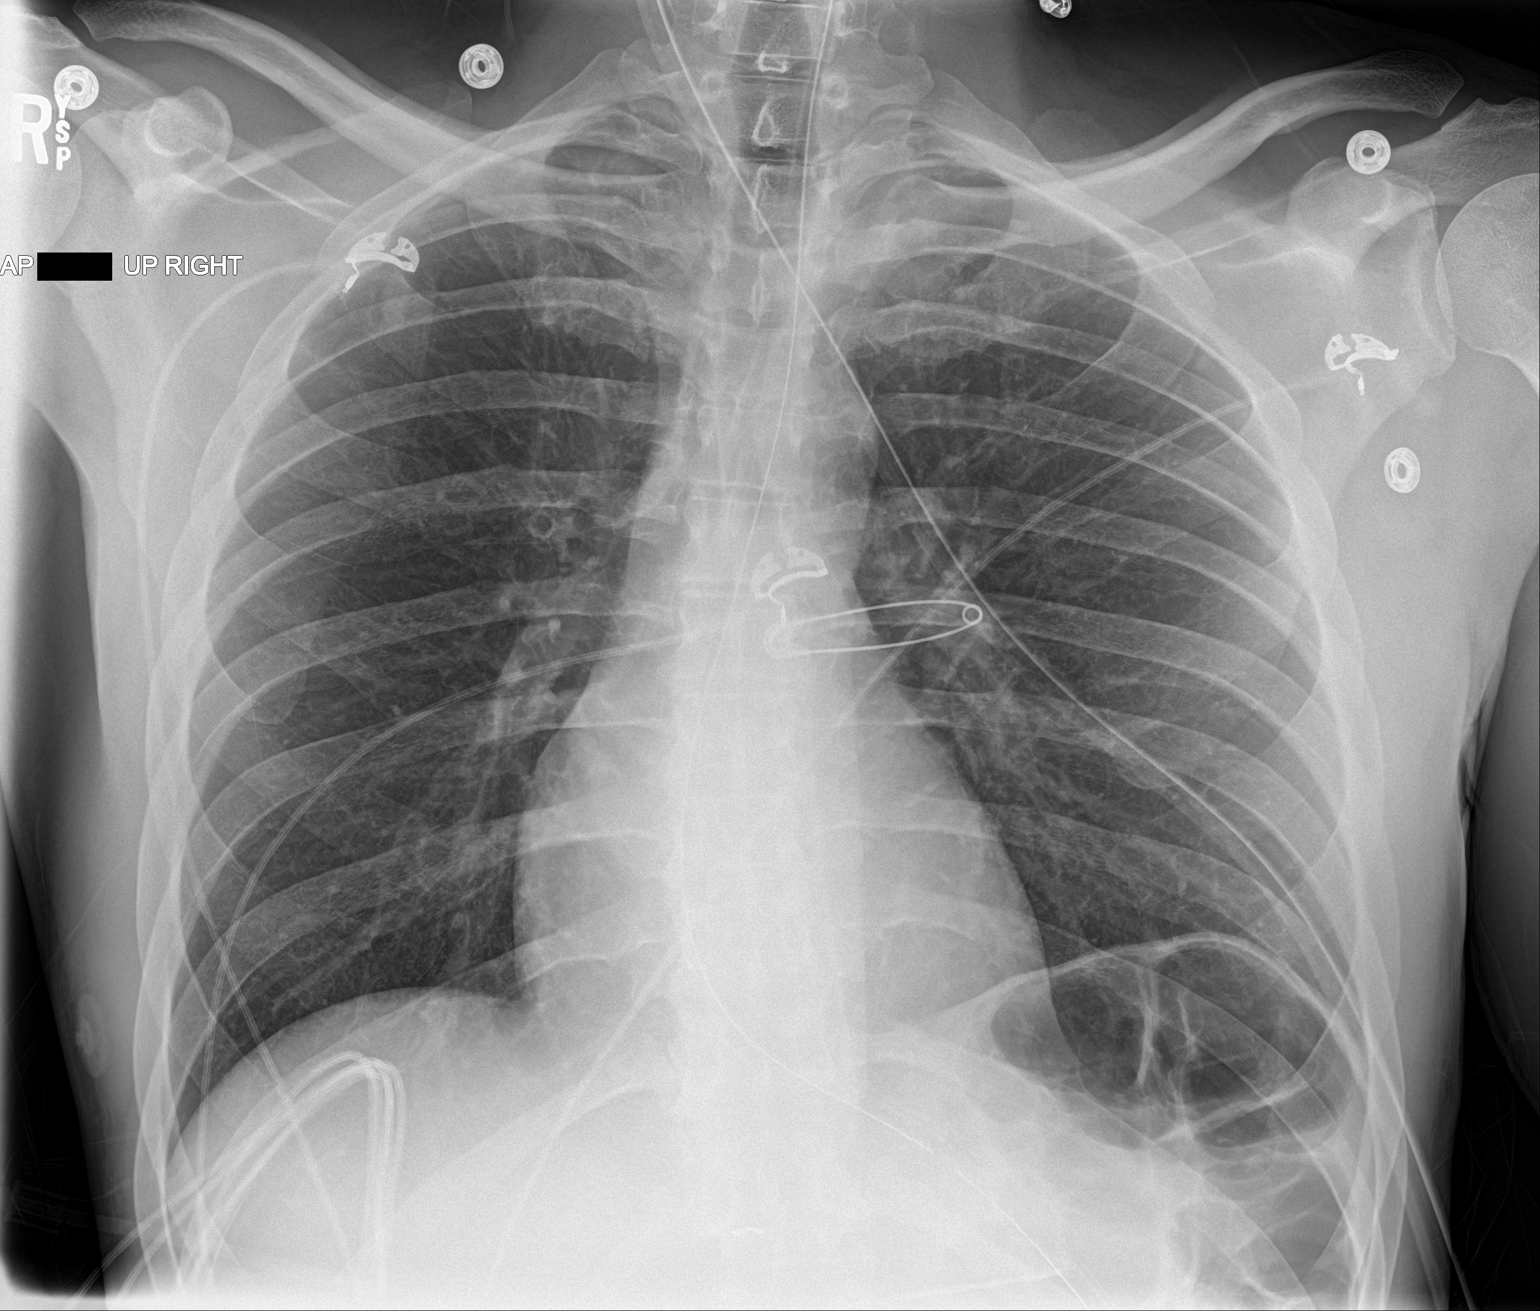

[1 of 1 positions shown; findings below may reference images not displayed]

FINDINGS: Stable heart size and mediastinal contours are within normal limits.
Enteric tube tip extends below the field of view into the abdomen.
Both lungs are clear. The visualized skeletal structures are
unremarkable.
IMPRESSION: No acute pulmonary process identified. Enteric tube tip extends
below the field of view into the abdomen.

By: Jyotsna Ebersole M.D.
